# Patient Record
Sex: Female | Born: 1965 | Hispanic: Yes | Marital: Married | Smoking: Never smoker
Health system: Southern US, Community
[De-identification: ages and names within clinical notes are randomized; demographics above are authoritative.]

## PROBLEM LIST (undated history)

## (undated) DIAGNOSIS — M1990 Unspecified osteoarthritis, unspecified site: Secondary | ICD-10-CM

## (undated) HISTORY — PX: NO PAST SURGERIES: SHX2092

---

## 2006-02-02 ENCOUNTER — Emergency Department: Payer: Self-pay | Admitting: Emergency Medicine

## 2007-01-15 IMAGING — CT CT ABD-PELV W/ CM
1 of 2 series · 15 of 32 positions shown, 19 images · non-contrast
Comparison: none

REASON FOR EXAM: Abdomen and flank pain
COMMENTS:

[Series 2: abdomen · axial · 0.69mm/px · z∈[-296,+152]mm · 15 of 62 slices shown, 19 images]
[im 3/62  soft-tissue]
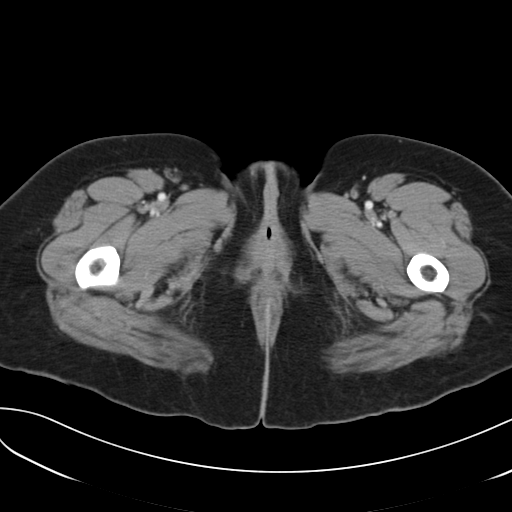
[im 3/62  bone]
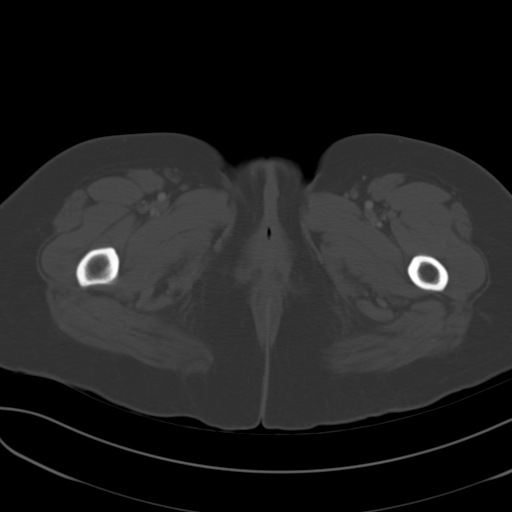
[im 8/62  soft-tissue]
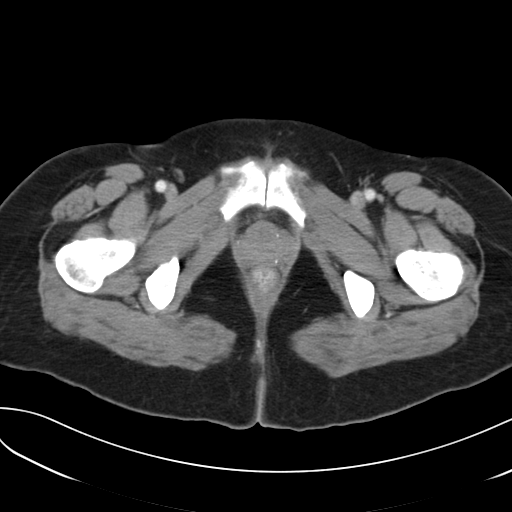
[im 13/62  soft-tissue]
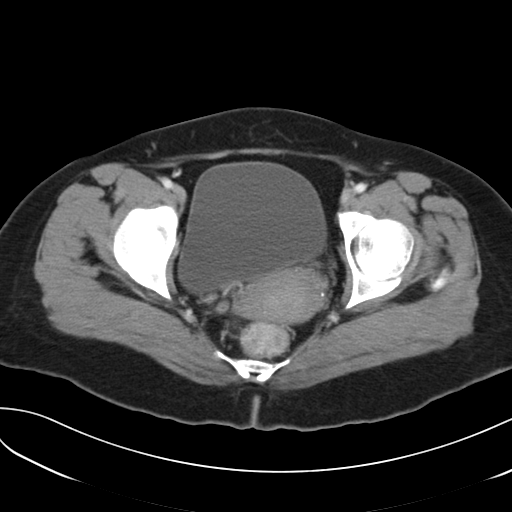
[im 18/62  soft-tissue]
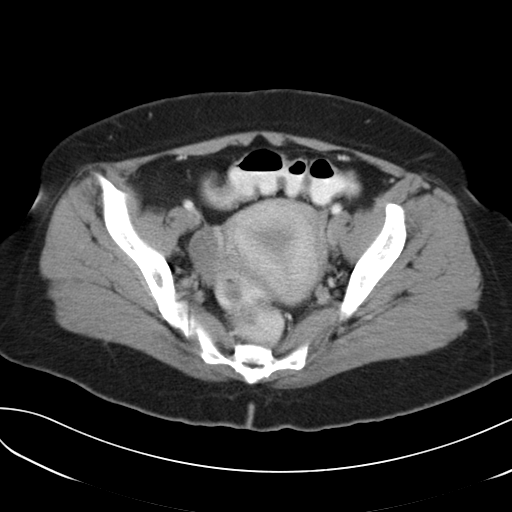
[im 21/62  soft-tissue]
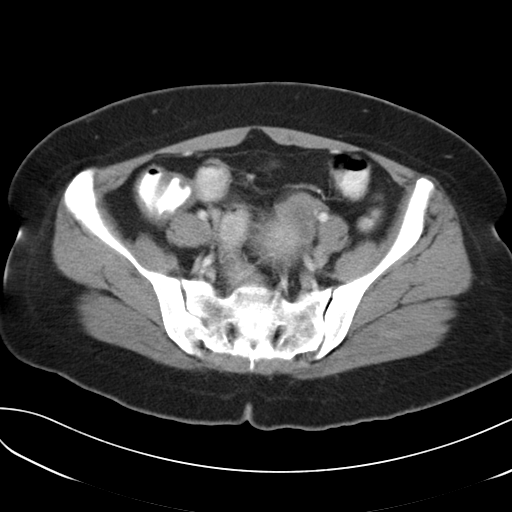
[im 26/62  soft-tissue]
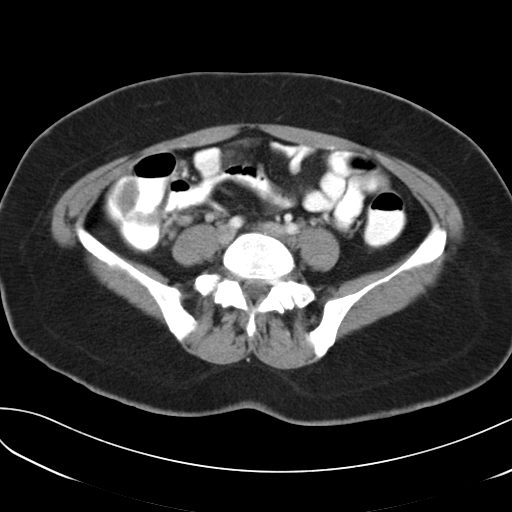
[im 31/62  soft-tissue]
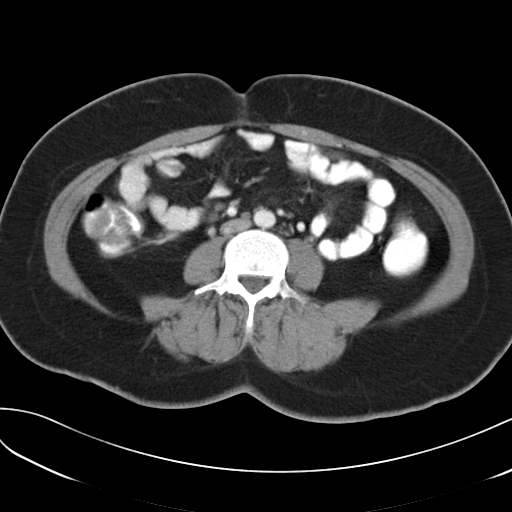
[im 36/62  soft-tissue]
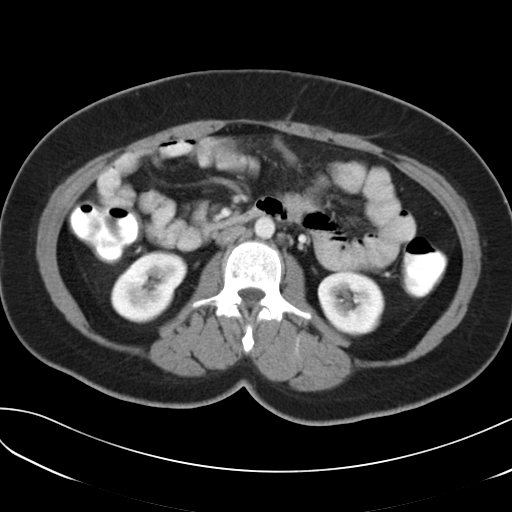
[im 41/62  soft-tissue]
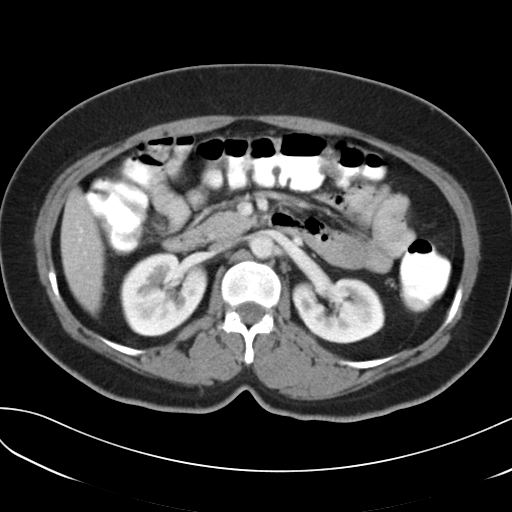
[im 41/62  bone]
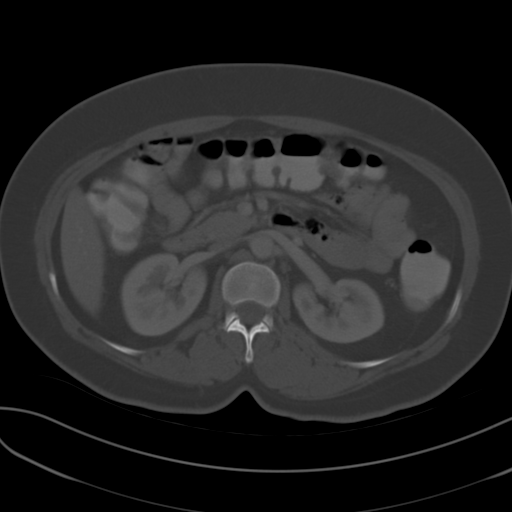
[im 44/62  soft-tissue]
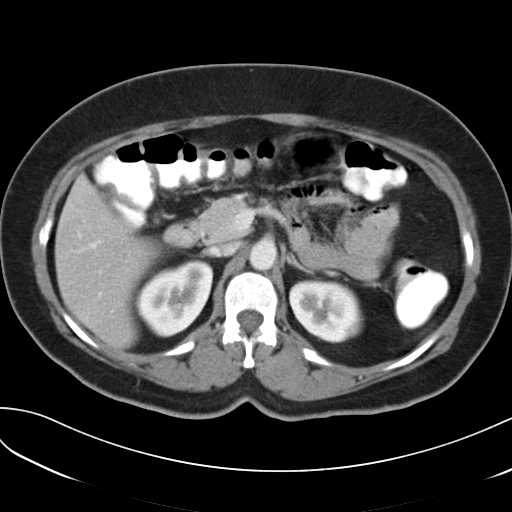
[im 49/62  soft-tissue]
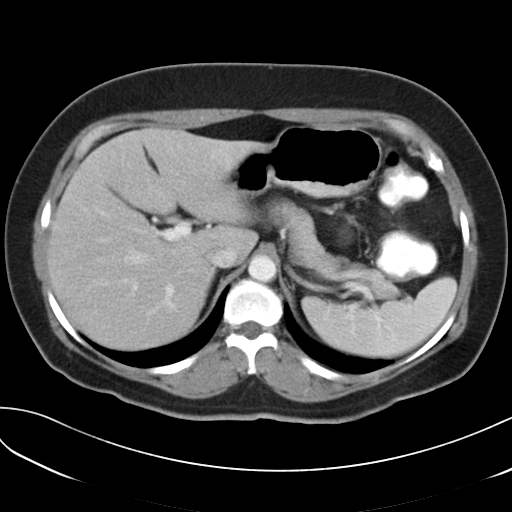
[im 51/62  lung]
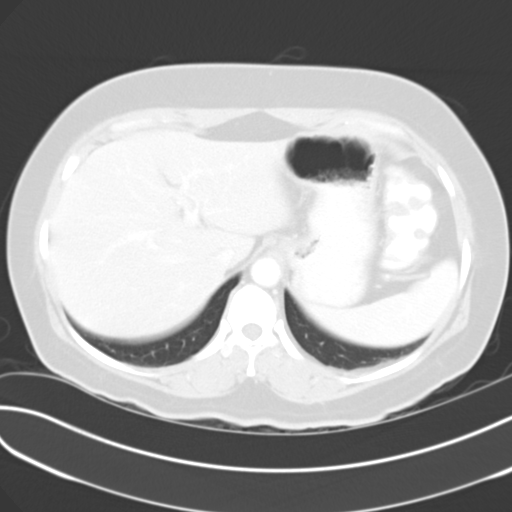
[im 54/62  soft-tissue]
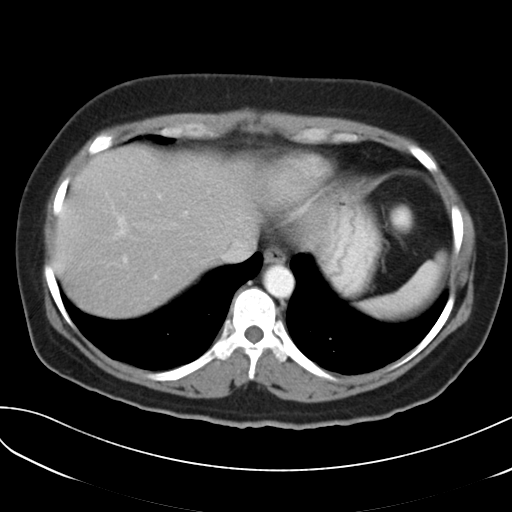
[im 54/62  lung]
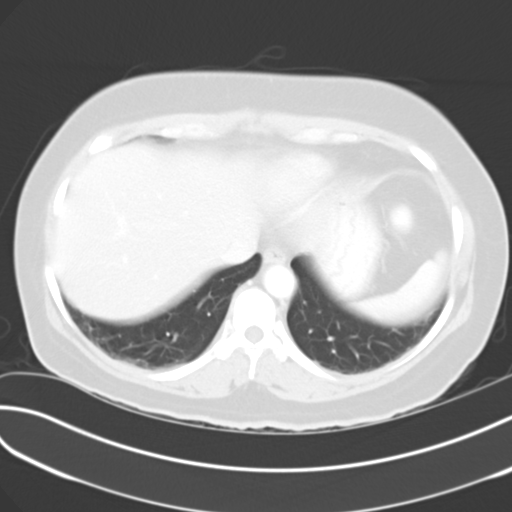
[im 56/62  lung]
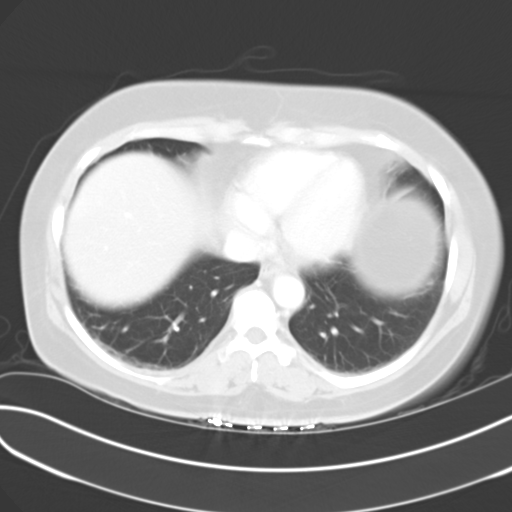
[im 59/62  soft-tissue]
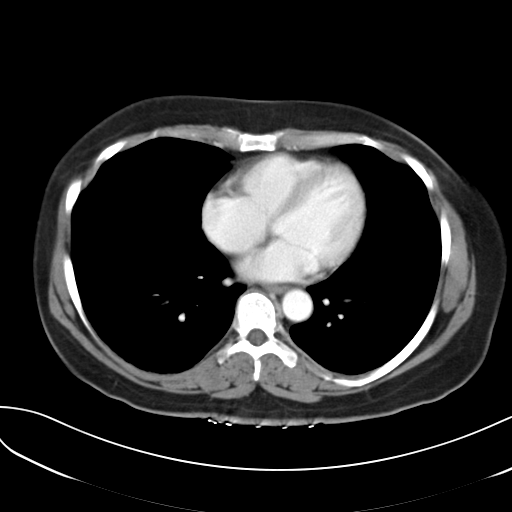
[im 59/62  lung]
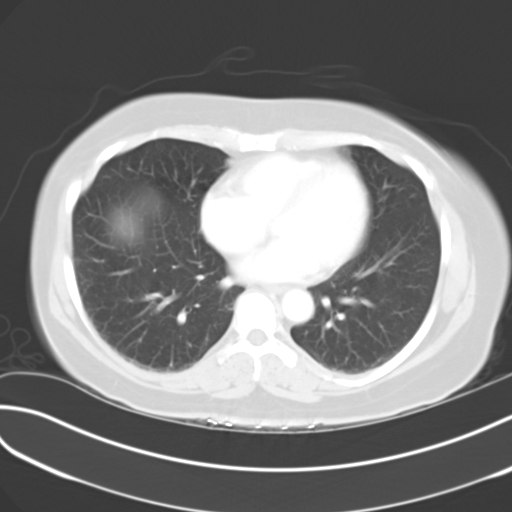

[15 of 32 positions shown; findings below may reference images not displayed]

PROCEDURE:     CT  - CT ABDOMEN / PELVIS  W  - February 02, 2006  [DATE]

RESULT:          Helical 8 mm sections were obtained from the lung bases
through the pubic symphysis status post intravenous administration of 100 ml
of [id number] and oral contrast.

Minimal bibasilar hypoventilation is appreciated.  The lung bases otherwise
are unremarkable.

The liver, spleen, adrenals, pancreas, and kidneys are unremarkable.  There
is no upper abdominal  free fluid, drainable loculated fluid collections,
masses or adenopathy.  The celiac, SMA, IMA, and portal vein are patent.
Evaluation of the RIGHT lower quadrant demonstrates no CT evidence to
suggest sequela of appendicitis.  Evaluation of the pelvis demonstrates no
evidence of free fluid, drainable loculated fluid collections, masses nor
adenopathy.  There is no evidence of intestinal obstruction nor
intraabdominal inflammatory change.

There is no evidence of abdominal aortic aneurysm.
IMPRESSION: No evidence of bowel obstruction or intraabdominal
inflammatory change.

Dr. Keana, of the emergency department, received a preliminary fax report
of these findings on 02/02/06 at [DATE] p.m. EST.

## 2008-09-02 ENCOUNTER — Emergency Department: Payer: Self-pay | Admitting: Emergency Medicine

## 2014-11-27 ENCOUNTER — Other Ambulatory Visit: Payer: Self-pay | Admitting: Nurse Practitioner

## 2014-11-27 DIAGNOSIS — Z1231 Encounter for screening mammogram for malignant neoplasm of breast: Secondary | ICD-10-CM

## 2015-01-08 ENCOUNTER — Other Ambulatory Visit: Payer: Self-pay | Admitting: Nurse Practitioner

## 2015-01-08 DIAGNOSIS — Z1231 Encounter for screening mammogram for malignant neoplasm of breast: Secondary | ICD-10-CM

## 2015-01-18 ENCOUNTER — Ambulatory Visit: Payer: Self-pay

## 2015-01-21 ENCOUNTER — Ambulatory Visit
Admission: RE | Admit: 2015-01-21 | Discharge: 2015-01-21 | Disposition: A | Discharge status: Home / Self Care | Payer: BLUE CROSS/BLUE SHIELD | Source: Ambulatory Visit | Attending: Nurse Practitioner | Admitting: Nurse Practitioner

## 2015-01-21 DIAGNOSIS — Z1231 Encounter for screening mammogram for malignant neoplasm of breast: Secondary | ICD-10-CM | POA: Diagnosis present

## 2017-10-26 ENCOUNTER — Other Ambulatory Visit: Payer: Self-pay | Admitting: Family Medicine

## 2017-10-26 DIAGNOSIS — Z1231 Encounter for screening mammogram for malignant neoplasm of breast: Secondary | ICD-10-CM

## 2019-01-03 ENCOUNTER — Other Ambulatory Visit: Payer: Self-pay

## 2019-01-03 DIAGNOSIS — Z20822 Contact with and (suspected) exposure to covid-19: Secondary | ICD-10-CM

## 2019-01-05 LAB — NOVEL CORONAVIRUS, NAA

## 2019-01-24 ENCOUNTER — Ambulatory Visit: Payer: Self-pay

## 2019-01-24 ENCOUNTER — Other Ambulatory Visit: Payer: Self-pay

## 2019-01-24 VITALS — BP 120/84 | HR 78 | Ht 63.0 in

## 2019-01-24 DIAGNOSIS — Z008 Encounter for other general examination: Secondary | ICD-10-CM

## 2019-01-24 LAB — POCT LIPID PANEL
Glucose Fasting, POC: 95 mg/dL (ref 70–99)
HDL: 53
LDL: 79
Non-HDL: 107
TC/HDL: 3
TC: 160
TRG: 142

## 2019-01-24 NOTE — Progress Notes (Signed)
     Patient ID: Shelia Jones, female    DOB: June 15, 1965, 53 y.o.   MRN: [id number]    Thank you!!  Apolonio Schneiders RN  [facility] Nurse Specialist [place]: [phone]  Cell:  [phone] Website: [email or web address]

## 2019-09-22 ENCOUNTER — Other Ambulatory Visit: Payer: Self-pay | Admitting: Orthopedic Surgery

## 2019-10-05 ENCOUNTER — Other Ambulatory Visit: Payer: Self-pay

## 2019-10-05 ENCOUNTER — Other Ambulatory Visit
Admission: RE | Admit: 2019-10-05 | Discharge: 2019-10-05 | Disposition: A | Discharge status: Home / Self Care | Payer: BC Managed Care – PPO | Source: Ambulatory Visit | Attending: Orthopedic Surgery | Admitting: Orthopedic Surgery

## 2019-10-05 HISTORY — DX: Unspecified osteoarthritis, unspecified site: M19.90

## 2019-10-05 NOTE — Patient Instructions (Signed)
Your procedure is scheduled on: Su procedimiento est programado para: 10-12-2019 Report to . Presntese a: [facility] To find out your arrival time please call [phone] between 1PM - 3PM on  Para saber su hora de llegada por favor llame al [phone] Eusebio Me la 1PM - 3PM el da 10-11-19  Remember: Instructions that are not followed completely may result in serious medical risk, up to and including death,  or upon the discretion of your surgeon and anesthesiologist your surgery may need to be rescheduled.  Recuerde: Las instrucciones que no se siguen completamente Armed forces logistics/support/administrative officer en un riesgo de salud grave, incluyendo hasta  la [place] o a discrecin de su cirujano y Scientific laboratory technician, su ciruga se puede posponer.   __X_ 1.Do not eat food after midnight the night before your procedure. No    gum chewing or hard candies. You may drink clear liquids up to 2 hours     before you are scheduled to arrive for your surgery- DO not drink clear     Liquids within 2 hours of the start of your surgery.     Clear Liquids include:    water, apple juice without pulp, clear carbohydrate drink such as    Clearfast of Gartorade, Black Coffee or Tea (Do not add anything to coffee or tea).      No coma nada despus de la medianoche de la noche anterior a su    procedimiento. No coma chicles ni caramelos duros. Puede tomar    lquidos claros hasta 2 horas antes de su hora programada de llegada al     hospital para su procedimiento. No tome lquidos claros durante el     transcurso de las 2 horas de su llegada programada al hospital para su     procedimiento, ya que esto puede llevar a que su procedimiento se    retrase o tenga que volver a Magazine features editor.  Los lquidos claros incluyen:          - Agua o jugo de [place] sin pulpa          - Bebidas claras con carbohidratos como ClearFast o Gatorade          - Caf negro o t claro (sin leche, sin cremas, no agregue nada al caf ni al t)  No tome nada  que no est en esta lista.  Los pacientes con diabetes tipo 1 y tipo 2 solo deben Printmaker.  Llame a la clnica de PreCare o a la unidad de Same Day Surgery si  tiene alguna pregunta sobre estas instrucciones.  BEBA LA BEBIDA DE CARBOHIDRATOS 2 HORAS ANTES DE LLEGAR AL HOSPITAL              _X__ 2.Do Not Smoke or use e-cigarettes For 24 Hours Prior to Your Surgery.    Do not use any chewable tobacco products for at least 6   hours prior to surgery.    No fume ni use cigarrillos electrnicos durante las 24 horas previas    a su [place].  No use ningn producto de tabaco masticable durante   al menos 6 horas antes de la [place].     __X_ 3. No alcohol for 24 hours before or after surgery.    No tome alcohol durante las 24 horas antes ni despus de la [place].   ____4. Bring all medications with you on the day of surgery if instructed.    Lleve todos los medicamentos con usted el da de su  ciruga si se le    ha indicado as.   __X__ 5. Notify your doctor if there is any change in your medical condition (cold,fever, infections).    Informe a su mdico si hay algn cambio en su condicin mdica  (resfriado, fiebre, infecciones).   Do not wear jewelry, make-up, hairpins, clips or nail polish.  No use joyas, maquillajes, pinzas/ganchos para el cabello ni esmalte de uas.  Do not wear lotions, powders, or perfumes. You may wear deodorant.  No use lociones, polvos o perfumes.  Puede usar desodorante.    Do not shave 48 hours prior to surgery. Men may shave face and neck.  No se afeite 48 horas antes de la [place].  Los hombres pueden [employer] cara  y el cuello.   Do not bring valuables to the hospital.   No lleve objetos [facility] is not responsible for any belongings or valuables.  [facility] no se hace responsable de ningn tipo de pertenencias u objetos de Geographical information systems officer.               Contacts, dentures or bridgework may not be worn into surgery.  Los lentes  de [place], las dentaduras postizas o puentes no se pueden usar en la [place].   Leave your suitcase in the car. After surgery it may be brought to your room.  Deje su maleta en el auto.  Despus de la ciruga podr traerla a su habitacin.   For patients admitted to the hospital, discharge time is determined by your  treatment team.  Para los pacientes que sean ingresados al hospital, el tiempo en el cual se le  dar de alta es determinado por su equipo de [place].   Patients discharged the day of surgery will not be allowed to drive home. A los pacientes que se les da de alta el mismo da de la ciruga no se les permitir conducir a Holiday representative.       ___X_ Use CHG Soap as directed          Utilice el jabn de CHG                CEPILLARSE LOS DIENTES LA MANANADE SU CIRUGIA   ___X_ Stop Anti-inflammatories on           Deje de tomar antiinflamatorios el da: 10/05/2019   ____ Stop supplements until after surgery            Deje de tomar suplementos hasta despus de la [place]

## 2019-10-10 ENCOUNTER — Encounter
Admission: RE | Admit: 2019-10-10 | Discharge: 2019-10-10 | Disposition: A | Discharge status: Home / Self Care | Payer: BC Managed Care – PPO | Source: Ambulatory Visit | Attending: Orthopedic Surgery | Admitting: Orthopedic Surgery

## 2019-10-10 ENCOUNTER — Other Ambulatory Visit: Payer: Self-pay

## 2019-10-10 DIAGNOSIS — Z20822 Contact with and (suspected) exposure to covid-19: Secondary | ICD-10-CM | POA: Diagnosis not present

## 2019-10-10 DIAGNOSIS — Z01818 Encounter for other preprocedural examination: Secondary | ICD-10-CM | POA: Insufficient documentation

## 2019-10-10 DIAGNOSIS — Z01810 Encounter for preprocedural cardiovascular examination: Secondary | ICD-10-CM | POA: Diagnosis not present

## 2019-10-10 LAB — CBC WITH DIFFERENTIAL/PLATELET
Abs Immature Granulocytes: 0.02 10*3/uL (ref 0.00–0.07)
Basophils Absolute: 0 10*3/uL (ref 0.0–0.1)
Basophils Relative: 1 %
Eosinophils Absolute: 0.1 10*3/uL (ref 0.0–0.5)
Eosinophils Relative: 2 %
HCT: 42.1 % (ref 36.0–46.0)
Hemoglobin: 13.4 g/dL (ref 12.0–15.0)
Immature Granulocytes: 0 %
Lymphocytes Relative: 36 %
Lymphs Abs: 1.7 10*3/uL (ref 0.7–4.0)
MCH: 26.8 pg (ref 26.0–34.0)
MCHC: 31.8 g/dL (ref 30.0–36.0)
MCV: 84.2 fL (ref 80.0–100.0)
Monocytes Absolute: 0.3 10*3/uL (ref 0.1–1.0)
Monocytes Relative: 7 %
Neutro Abs: 2.5 10*3/uL (ref 1.7–7.7)
Neutrophils Relative %: 54 %
Platelets: 283 10*3/uL (ref 150–400)
RBC: 5 MIL/uL (ref 3.87–5.11)
RDW: 15.8 % — ABNORMAL HIGH (ref 11.5–15.5)
WBC: 4.6 10*3/uL (ref 4.0–10.5)
nRBC: 0 % (ref 0.0–0.2)

## 2019-10-10 LAB — URINALYSIS, ROUTINE W REFLEX MICROSCOPIC
Bilirubin Urine: NEGATIVE
Glucose, UA: NEGATIVE mg/dL
Hgb urine dipstick: NEGATIVE
Ketones, ur: NEGATIVE mg/dL
Leukocytes,Ua: NEGATIVE
Nitrite: NEGATIVE
Protein, ur: NEGATIVE mg/dL
Specific Gravity, Urine: 1.004 — ABNORMAL LOW (ref 1.005–1.030)
pH: 5 (ref 5.0–8.0)

## 2019-10-10 LAB — COMPREHENSIVE METABOLIC PANEL
ALT: 15 U/L (ref 0–44)
AST: 17 U/L (ref 15–41)
Albumin: 4.1 g/dL (ref 3.5–5.0)
Alkaline Phosphatase: 67 U/L (ref 38–126)
Anion gap: 9 (ref 5–15)
BUN: 10 mg/dL (ref 6–20)
CO2: 26 mmol/L (ref 22–32)
Calcium: 9 mg/dL (ref 8.9–10.3)
Chloride: 105 mmol/L (ref 98–111)
Creatinine, Ser: 0.64 mg/dL (ref 0.44–1.00)
GFR calc Af Amer: >60 mL/min (ref >60)
GFR calc non Af Amer: >60 mL/min (ref >60)
Glucose, Bld: 93 mg/dL (ref 70–99)
Potassium: 3.8 mmol/L (ref 3.5–5.1)
Sodium: 140 mmol/L (ref 135–145)
Total Bilirubin: 0.7 mg/dL (ref 0.3–1.2)
Total Protein: 7.2 g/dL (ref 6.5–8.1)

## 2019-10-10 LAB — SURGICAL PCR SCREEN
MRSA, PCR: NEGATIVE
Staphylococcus aureus: NEGATIVE

## 2019-10-10 LAB — TYPE AND SCREEN
ABO/RH(D): A POS
Antibody Screen: NEGATIVE

## 2019-10-10 LAB — SARS CORONAVIRUS 2 (TAT 6-24 HRS): SARS Coronavirus 2: NEGATIVE

## 2019-10-11 NOTE — H&P (Signed)
Chief Complaint  Patient presents with  . Left Knee - Pain  . Right Knee - Pain   Shelia Jones is a 54 y.o. female who presents today for evaluation of right knee pain. Patient has had years of progressive severe right knee pain that has failed treatment with cortisone and gel injections. Patient states her pain is interfering with quality of life and activities of daily living. She has a hard time walking, working due to her knee swelling and giving away. Her pain is located on the medial joint line of the right knee. Pain is severe 10 out of 10. She takes ibuprofen with little relief. She also notes some mild left knee pain but this is mild, 1 out of 10 along the medial joint line. She denies any groin pain, back pain numbness tingling radicular symptoms. 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 Patient is ready for total knee arthroplasty, states she is unable to keep up with her family members.  Past Medical History: History reviewed. No pertinent past medical history.  Past Surgical History: History reviewed. No pertinent surgical history.  Past Family History: Family History  Problem Relation Age of Onset  . No Known Problems Mother   Medications: Current Outpatient Medications Ordered in Epic  Medication Sig Dispense Refill  . ibuprofen (MOTRIN) 200 MG tablet Take 200 mg by mouth every 6 (six) hours as needed for Pain  . VITAMIN D2 50,000 unit capsule 2   No current Epic-ordered facility-administered medications on file.   Allergies: No Known Allergies   Review of Systems:  A comprehensive 14 point ROS was performed, reviewed by me today, and the pertinent orthopaedic findings are documented in the HPI.  Exam: BP 128/84  Wt 80.8 kg (178 lb 3.2 oz)  BMI 32.59 kg/m  General:  Well developed, well nourished, no apparent distress, normal affect, normal gait with no antalgic component.   HEENT: Head normocephalic, atraumatic, PERRL.   Abdomen: Soft,  non tender, non distended, Bowel sounds present.  Heart: Examination of the heart reveals regular, rate, and rhythm. There is no murmur noted on ascultation. There is a normal apical pulse.  Lungs: Lungs are clear to auscultation. There is no wheeze, rhonchi, or crackles. There is normal expansion of bilateral chest walls.   Examination of the right knee shows no swelling warmth erythema. Mild effusion. Severe tenderness along medial joint line. Severe pain with varus stress testing with no laxity. Mild varus deformity is not passively correctable. She is able to straight leg raise. Patella tracks well. No Baker's cyst noted. She has -3 to 115 degrees range of motion. She is neuro vas intact in right lower extremity.  Left knee shows no swelling warmth erythema or effusion. Nontender along the medial or lateral joint line. Stable to valgus varus stress testing. She is able to straight leg raise. Patella tracks well.  AP lateral sunrise views of the right knee are ordered interpreted by me in the office today. Impression: Patient has complete loss of joint space in the medial compartment of the right knee with varus deformity, spurring along the medial femoral condyle medial tibial plateau. Mild subchondral changes in the lateral compartment.  AP lateral sunrise views of the left knee are ordered interpreted by me in the office today. Impression: Patient has very mild joint space narrowing in the medial and lateral compartment with mild subchondral changes noted. No valgus or varus deformity. Patella tracks well the trochlear groove. No evidence of  acute bony abnormality or abnormal bony lesions. Mild patellofemoral arthritis with sclerotic changes present. Slight lateral tracking of the patella trochlear groove.  Impression: Primary osteoarthritis of right knee [M17.11] Primary osteoarthritis of right knee (primary encounter diagnosis) Primary osteoarthritis of left knee  Plan:  93. 54 year old  female with severe right knee osteoarthritis, complete loss of joint space in the medial compartment. She has failed all conservative treatment options consisting of over-the-counter medications, cortisone injections, viscosupplementation, activity modification and bracing. Pain interferes with her quality of life and activities of daily living. She would like to proceed with right total knee arthroplasty. Risks, benefits, complications of a right total knee arthroplasty have been discussed with the patient through interpreter. Patient has agreed and consented the procedure with Dr. Hessie Knows  This note was generated in part with voice recognition software and I apologize for any typographical errors that were not detected and corrected.  Feliberto Gottron MPA-C    Electronically signed by Feliberto Gottron, PA at 09/14/2019 10:16 AM EDT   Reviewed paper H+P, will be scanned into chart. No changes noted.

## 2019-10-12 ENCOUNTER — Observation Stay
Admission: RE | Admit: 2019-10-12 | Discharge: 2019-10-14 | Disposition: A | Discharge status: Home / Self Care | Payer: BC Managed Care – PPO | Attending: Orthopedic Surgery | Admitting: Orthopedic Surgery

## 2019-10-12 ENCOUNTER — Observation Stay: Payer: BC Managed Care – PPO

## 2019-10-12 ENCOUNTER — Inpatient Hospital Stay: Payer: BC Managed Care – PPO | Admitting: Certified Registered Nurse Anesthetist

## 2019-10-12 ENCOUNTER — Encounter
Admission: RE | Disposition: A | Discharge status: Home / Self Care | Payer: Self-pay | Source: Home / Self Care | Attending: Orthopedic Surgery

## 2019-10-12 ENCOUNTER — Encounter: Payer: Self-pay | Admitting: Orthopedic Surgery

## 2019-10-12 ENCOUNTER — Other Ambulatory Visit: Payer: Self-pay

## 2019-10-12 DIAGNOSIS — M170 Bilateral primary osteoarthritis of knee: Principal | ICD-10-CM | POA: Insufficient documentation

## 2019-10-12 DIAGNOSIS — G8918 Other acute postprocedural pain: Secondary | ICD-10-CM

## 2019-10-12 DIAGNOSIS — Z96651 Presence of right artificial knee joint: Secondary | ICD-10-CM

## 2019-10-12 HISTORY — PX: TOTAL KNEE ARTHROPLASTY: SHX125

## 2019-10-12 HISTORY — PX: APPLICATION OF WOUND VAC: SHX5189

## 2019-10-12 LAB — CBC
HCT: 38.6 % (ref 36.0–46.0)
Hemoglobin: 12.4 g/dL (ref 12.0–15.0)
MCH: 26.9 pg (ref 26.0–34.0)
MCHC: 32.1 g/dL (ref 30.0–36.0)
MCV: 83.7 fL (ref 80.0–100.0)
Platelets: 260 10*3/uL (ref 150–400)
RBC: 4.61 MIL/uL (ref 3.87–5.11)
RDW: 15.8 % — ABNORMAL HIGH (ref 11.5–15.5)
WBC: 10.1 10*3/uL (ref 4.0–10.5)
nRBC: 0 % (ref 0.0–0.2)

## 2019-10-12 LAB — BASIC METABOLIC PANEL
Anion gap: 5 (ref 5–15)
BUN: 8 mg/dL (ref 6–20)
CO2: 29 mmol/L (ref 22–32)
Calcium: 8.9 mg/dL (ref 8.9–10.3)
Chloride: 109 mmol/L (ref 98–111)
Creatinine, Ser: 0.51 mg/dL (ref 0.44–1.00)
GFR calc Af Amer: >60 mL/min (ref >60)
GFR calc non Af Amer: >60 mL/min (ref >60)
Glucose, Bld: 94 mg/dL (ref 70–99)
Potassium: 4 mmol/L (ref 3.5–5.1)
Sodium: 143 mmol/L (ref 135–145)

## 2019-10-12 LAB — ABO/RH: ABO/RH(D): A POS

## 2019-10-12 LAB — POCT PREGNANCY, URINE: Preg Test, Ur: NEGATIVE

## 2019-10-12 SURGERY — ARTHROPLASTY, KNEE, TOTAL
Anesthesia: Spinal | Site: Knee | Laterality: Right

## 2019-10-12 MED ORDER — LIDOCAINE HCL (PF) 2 % IJ SOLN
INTRAMUSCULAR | Status: AC
  Filled 2019-10-12: qty 5

## 2019-10-12 MED ORDER — MORPHINE SULFATE 10 MG/ML IJ SOLN
INTRAMUSCULAR | Status: DC | PRN
  Administered 2019-10-12: 10 mg via INTRAVENOUS

## 2019-10-12 MED ORDER — FENTANYL CITRATE (PF) 100 MCG/2ML IJ SOLN
INTRAMUSCULAR | Status: AC
  Filled 2019-10-12: qty 2

## 2019-10-12 MED ORDER — ZOLPIDEM TARTRATE 5 MG PO TABS: 5.0000 mg | ORAL_TABLET | Freq: Every evening | ORAL | Status: DC | PRN

## 2019-10-12 MED ORDER — CHLORHEXIDINE GLUCONATE 0.12 % MT SOLN
OROMUCOSAL | Status: AC
  Administered 2019-10-12: 15 mL via OROMUCOSAL
  Filled 2019-10-12: qty 15

## 2019-10-12 MED ORDER — TRANEXAMIC ACID-NACL 1000-0.7 MG/100ML-% IV SOLN
1000.0000 mg | Freq: Once | INTRAVENOUS | Status: AC
  Administered 2019-10-12: 1000 mg via INTRAVENOUS

## 2019-10-12 MED ORDER — METHOCARBAMOL 500 MG PO TABS
500.0000 mg | ORAL_TABLET | Freq: Four times a day (QID) | ORAL | Status: DC | PRN
  Administered 2019-10-13 (×2): 500 mg via ORAL
  Filled 2019-10-12 (×3): qty 1

## 2019-10-12 MED ORDER — CHLORHEXIDINE GLUCONATE 0.12 % MT SOLN: 15.0000 mL | Freq: Once | OROMUCOSAL | Status: AC

## 2019-10-12 MED ORDER — METOCLOPRAMIDE HCL 5 MG/ML IJ SOLN: 5.0000 mg | Freq: Three times a day (TID) | INTRAMUSCULAR | Status: DC | PRN

## 2019-10-12 MED ORDER — TRANEXAMIC ACID-NACL 1000-0.7 MG/100ML-% IV SOLN
INTRAVENOUS | Status: AC
  Filled 2019-10-12: qty 100

## 2019-10-12 MED ORDER — ACETAMINOPHEN 325 MG PO TABS
325.0000 mg | ORAL_TABLET | Freq: Four times a day (QID) | ORAL | Status: DC | PRN
  Administered 2019-10-13: 650 mg via ORAL
  Filled 2019-10-12: qty 2

## 2019-10-12 MED ORDER — MORPHINE SULFATE (PF) 10 MG/ML IV SOLN
INTRAVENOUS | Status: AC
  Filled 2019-10-12: qty 1

## 2019-10-12 MED ORDER — FAMOTIDINE 20 MG PO TABS
ORAL_TABLET | ORAL | Status: AC
  Administered 2019-10-12: 20 mg via ORAL
  Filled 2019-10-12: qty 1

## 2019-10-12 MED ORDER — METOCLOPRAMIDE HCL 10 MG PO TABS: 5.0000 mg | ORAL_TABLET | Freq: Three times a day (TID) | ORAL | Status: DC | PRN

## 2019-10-12 MED ORDER — SODIUM CHLORIDE 0.9 % IV SOLN
INTRAVENOUS | Status: DC | PRN
  Administered 2019-10-12: 30 ug/min via INTRAVENOUS

## 2019-10-12 MED ORDER — BUPIVACAINE-EPINEPHRINE (PF) 0.25% -1:200000 IJ SOLN
INTRAMUSCULAR | Status: DC | PRN
  Administered 2019-10-12: 30 mL via PERINEURAL

## 2019-10-12 MED ORDER — PHENYLEPHRINE HCL (PRESSORS) 10 MG/ML IV SOLN
INTRAVENOUS | Status: DC | PRN
  Administered 2019-10-12: 100 ug via INTRAVENOUS

## 2019-10-12 MED ORDER — PANTOPRAZOLE SODIUM 40 MG PO TBEC
40.0000 mg | DELAYED_RELEASE_TABLET | Freq: Every day | ORAL | Status: DC
  Administered 2019-10-13 – 2019-10-14 (×2): 40 mg via ORAL
  Filled 2019-10-12 (×2): qty 1

## 2019-10-12 MED ORDER — HYDROCODONE-ACETAMINOPHEN 7.5-325 MG PO TABS
1.0000 | ORAL_TABLET | ORAL | Status: DC | PRN
  Administered 2019-10-13: 2 via ORAL
  Filled 2019-10-12: qty 2

## 2019-10-12 MED ORDER — MENTHOL 3 MG MT LOZG
1.0000 | LOZENGE | OROMUCOSAL | Status: DC | PRN
  Filled 2019-10-12: qty 9

## 2019-10-12 MED ORDER — METHOCARBAMOL 1000 MG/10ML IJ SOLN
500.0000 mg | Freq: Four times a day (QID) | INTRAVENOUS | Status: DC | PRN
  Filled 2019-10-12: qty 5

## 2019-10-12 MED ORDER — ONDANSETRON HCL 4 MG PO TABS: 4.0000 mg | ORAL_TABLET | Freq: Four times a day (QID) | ORAL | Status: DC | PRN

## 2019-10-12 MED ORDER — TRAMADOL HCL 50 MG PO TABS
50.0000 mg | ORAL_TABLET | Freq: Four times a day (QID) | ORAL | Status: DC
  Administered 2019-10-12 – 2019-10-14 (×8): 50 mg via ORAL
  Filled 2019-10-12 (×8): qty 1

## 2019-10-12 MED ORDER — ORAL CARE MOUTH RINSE: 15.0000 mL | Freq: Once | OROMUCOSAL | Status: AC

## 2019-10-12 MED ORDER — ONDANSETRON HCL 4 MG/2ML IJ SOLN
INTRAMUSCULAR | Status: DC | PRN
  Administered 2019-10-12: 4 mg via INTRAVENOUS

## 2019-10-12 MED ORDER — ENOXAPARIN SODIUM 30 MG/0.3ML ~~LOC~~ SOLN
30.0000 mg | Freq: Two times a day (BID) | SUBCUTANEOUS | Status: DC
  Administered 2019-10-13 – 2019-10-14 (×3): 30 mg via SUBCUTANEOUS
  Filled 2019-10-12 (×3): qty 0.3

## 2019-10-12 MED ORDER — MAGNESIUM CITRATE PO SOLN
1.0000 | Freq: Once | ORAL | Status: DC | PRN
  Filled 2019-10-12: qty 296

## 2019-10-12 MED ORDER — BISACODYL 10 MG RE SUPP
10.0000 mg | Freq: Every day | RECTAL | Status: DC | PRN
  Administered 2019-10-14: 10 mg via RECTAL
  Filled 2019-10-12: qty 1

## 2019-10-12 MED ORDER — PROPOFOL 10 MG/ML IV BOLUS
INTRAVENOUS | Status: DC | PRN
  Administered 2019-10-12: 20 mg via INTRAVENOUS
  Administered 2019-10-12: 30 mg via INTRAVENOUS
  Administered 2019-10-12: 20 mg via INTRAVENOUS

## 2019-10-12 MED ORDER — PROPOFOL 500 MG/50ML IV EMUL
INTRAVENOUS | Status: AC
  Filled 2019-10-12: qty 50

## 2019-10-12 MED ORDER — KETOROLAC TROMETHAMINE 30 MG/ML IJ SOLN
INTRAMUSCULAR | Status: AC
  Filled 2019-10-12: qty 1

## 2019-10-12 MED ORDER — KETOROLAC TROMETHAMINE 30 MG/ML IJ SOLN
INTRAMUSCULAR | Status: DC | PRN
  Administered 2019-10-12: 30 mg via INTRAVENOUS

## 2019-10-12 MED ORDER — ONDANSETRON HCL 4 MG/2ML IJ SOLN
INTRAMUSCULAR | Status: AC
  Filled 2019-10-12: qty 2

## 2019-10-12 MED ORDER — ONDANSETRON HCL 4 MG/2ML IJ SOLN: 4.0000 mg | Freq: Once | INTRAMUSCULAR | Status: DC | PRN

## 2019-10-12 MED ORDER — BUPIVACAINE HCL (PF) 0.25 % IJ SOLN
INTRAMUSCULAR | Status: AC
  Filled 2019-10-12: qty 60

## 2019-10-12 MED ORDER — FENTANYL CITRATE (PF) 100 MCG/2ML IJ SOLN: 25.0000 ug | INTRAMUSCULAR | Status: DC | PRN

## 2019-10-12 MED ORDER — DOCUSATE SODIUM 100 MG PO CAPS
100.0000 mg | ORAL_CAPSULE | Freq: Two times a day (BID) | ORAL | Status: DC
  Administered 2019-10-12 – 2019-10-14 (×4): 100 mg via ORAL
  Filled 2019-10-12 (×4): qty 1

## 2019-10-12 MED ORDER — FAMOTIDINE 20 MG PO TABS: 20.0000 mg | ORAL_TABLET | Freq: Once | ORAL | Status: AC

## 2019-10-12 MED ORDER — SODIUM CHLORIDE 0.9 % IV SOLN
INTRAVENOUS | Status: DC | PRN
  Administered 2019-10-12: 60 mL

## 2019-10-12 MED ORDER — BUPIVACAINE HCL (PF) 0.5 % IJ SOLN
INTRAMUSCULAR | Status: DC | PRN
  Administered 2019-10-12: 3 mL

## 2019-10-12 MED ORDER — LACTATED RINGERS IV SOLN: INTRAVENOUS | Status: DC

## 2019-10-12 MED ORDER — SODIUM CHLORIDE FLUSH 0.9 % IV SOLN
INTRAVENOUS | Status: AC
  Filled 2019-10-12: qty 80

## 2019-10-12 MED ORDER — CEFAZOLIN SODIUM-DEXTROSE 2-4 GM/100ML-% IV SOLN
2.0000 g | Freq: Four times a day (QID) | INTRAVENOUS | Status: AC
  Administered 2019-10-12 (×2): 2 g via INTRAVENOUS
  Filled 2019-10-12 (×2): qty 100

## 2019-10-12 MED ORDER — BUPIVACAINE HCL (PF) 0.5 % IJ SOLN
INTRAMUSCULAR | Status: AC
  Filled 2019-10-12: qty 10

## 2019-10-12 MED ORDER — PROPOFOL 10 MG/ML IV BOLUS
INTRAVENOUS | Status: AC
  Filled 2019-10-12: qty 20

## 2019-10-12 MED ORDER — FENTANYL CITRATE (PF) 100 MCG/2ML IJ SOLN
INTRAMUSCULAR | Status: DC | PRN
  Administered 2019-10-12 (×2): 25 ug via INTRAVENOUS

## 2019-10-12 MED ORDER — NEOMYCIN-POLYMYXIN B GU 40-200000 IR SOLN
Status: AC
  Filled 2019-10-12: qty 20

## 2019-10-12 MED ORDER — HYDROCODONE-ACETAMINOPHEN 5-325 MG PO TABS
1.0000 | ORAL_TABLET | ORAL | Status: DC | PRN
  Administered 2019-10-13 – 2019-10-14 (×3): 2 via ORAL
  Filled 2019-10-12 (×3): qty 2

## 2019-10-12 MED ORDER — ONDANSETRON HCL 4 MG/2ML IJ SOLN: 4.0000 mg | Freq: Four times a day (QID) | INTRAMUSCULAR | Status: DC | PRN

## 2019-10-12 MED ORDER — CEFAZOLIN SODIUM-DEXTROSE 2-4 GM/100ML-% IV SOLN
2.0000 g | INTRAVENOUS | Status: AC
  Administered 2019-10-12: 2 g via INTRAVENOUS

## 2019-10-12 MED ORDER — BUPIVACAINE LIPOSOME 1.3 % IJ SUSP
INTRAMUSCULAR | Status: AC
  Filled 2019-10-12: qty 20

## 2019-10-12 MED ORDER — MIDAZOLAM HCL 5 MG/5ML IJ SOLN
INTRAMUSCULAR | Status: DC | PRN
  Administered 2019-10-12: 2 mg via INTRAVENOUS

## 2019-10-12 MED ORDER — MIDAZOLAM HCL 2 MG/2ML IJ SOLN
INTRAMUSCULAR | Status: AC
  Filled 2019-10-12: qty 2

## 2019-10-12 MED ORDER — PROPOFOL 500 MG/50ML IV EMUL
INTRAVENOUS | Status: DC | PRN
  Administered 2019-10-12: 100 ug/kg/min via INTRAVENOUS

## 2019-10-12 MED ORDER — ALUM & MAG HYDROXIDE-SIMETH 200-200-20 MG/5ML PO SUSP: 30.0000 mL | ORAL | Status: DC | PRN

## 2019-10-12 MED ORDER — CEFAZOLIN SODIUM-DEXTROSE 2-4 GM/100ML-% IV SOLN
INTRAVENOUS | Status: AC
  Filled 2019-10-12: qty 100

## 2019-10-12 MED ORDER — PHENOL 1.4 % MT LIQD
1.0000 | OROMUCOSAL | Status: DC | PRN
  Filled 2019-10-12: qty 177

## 2019-10-12 MED ORDER — MAGNESIUM HYDROXIDE 400 MG/5ML PO SUSP
30.0000 mL | Freq: Every day | ORAL | Status: DC | PRN
  Administered 2019-10-14: 30 mL via ORAL
  Filled 2019-10-12: qty 30

## 2019-10-12 MED ORDER — PHENYLEPHRINE HCL (PRESSORS) 10 MG/ML IV SOLN
INTRAVENOUS | Status: AC
  Filled 2019-10-12: qty 1

## 2019-10-12 MED ORDER — DIPHENHYDRAMINE HCL 12.5 MG/5ML PO ELIX: 12.5000 mg | ORAL_SOLUTION | ORAL | Status: DC | PRN

## 2019-10-12 MED ORDER — SODIUM CHLORIDE 0.9 % IV SOLN: INTRAVENOUS | Status: DC

## 2019-10-12 MED ORDER — LIDOCAINE HCL (CARDIAC) PF 50 MG/5ML IV SOSY
PREFILLED_SYRINGE | INTRAVENOUS | Status: DC | PRN
  Administered 2019-10-12: 5 mL via INTRAVENOUS

## 2019-10-12 MED ORDER — MORPHINE SULFATE (PF) 2 MG/ML IV SOLN: 0.5000 mg | INTRAVENOUS | Status: DC | PRN

## 2019-10-12 MED ORDER — NEOMYCIN-POLYMYXIN B GU 40-200000 IR SOLN
Status: DC | PRN
  Administered 2019-10-12: 16 mL

## 2019-10-12 SURGICAL SUPPLY — 65 items
BLADE SAGITTAL 25.0X1.19X90 (BLADE) ×2 IMPLANT
BLADE SAGITTAL 25.0X1.19X90MM (BLADE) ×1
BNDG ELASTIC 6X5.8 VLCR STR LF (GAUZE/BANDAGES/DRESSINGS) ×3 IMPLANT
CANISTER SUCT 1200ML W/VALVE (MISCELLANEOUS) ×3 IMPLANT
CANISTER SUCT 3000ML PPV (MISCELLANEOUS) ×6 IMPLANT
CANISTER WOUND CARE 500ML ATS (WOUND CARE) ×3 IMPLANT
CEMENT HV SMART SET (Cement) ×6 IMPLANT
CHLORAPREP W/TINT 26 (MISCELLANEOUS) ×6 IMPLANT
COOLER POLAR GLACIER W/PUMP (MISCELLANEOUS) ×3 IMPLANT
COVER WAND RF STERILE (DRAPES) ×3 IMPLANT
CUFF TOURN SGL QUICK 30 (TOURNIQUET CUFF) ×2
CUFF TRNQT CYL 30X4X21-28X (TOURNIQUET CUFF) ×1 IMPLANT
DRAPE 3/4 80X56 (DRAPES) ×6 IMPLANT
ELECT CAUTERY BLADE 6.4 (BLADE) ×3 IMPLANT
ELECT REM PT RETURN 9FT ADLT (ELECTROSURGICAL) ×3
ELECTRODE REM PT RTRN 9FT ADLT (ELECTROSURGICAL) ×1 IMPLANT
FEMORAL COMPONENT RIGHT SZ2P (Femur) ×3 IMPLANT
GAUZE SPONGE 4X4 12PLY STRL (GAUZE/BANDAGES/DRESSINGS) ×3 IMPLANT
GAUZE XEROFORM 1X8 LF (GAUZE/BANDAGES/DRESSINGS) ×3 IMPLANT
GLOVE BIOGEL PI IND STRL 9 (GLOVE) ×1 IMPLANT
GLOVE BIOGEL PI INDICATOR 9 (GLOVE) ×2
GLOVE INDICATOR 8.0 STRL GRN (GLOVE) ×3 IMPLANT
GLOVE SURG ORTHO 8.0 STRL STRW (GLOVE) ×3 IMPLANT
GLOVE SURG SYN 9.0  PF PI (GLOVE) ×2
GLOVE SURG SYN 9.0 PF PI (GLOVE) ×1 IMPLANT
GOWN SRG 2XL LVL 4 RGLN SLV (GOWNS) ×1 IMPLANT
GOWN STRL NON-REIN 2XL LVL4 (GOWNS) ×2
GOWN STRL REUS W/ TWL LRG LVL3 (GOWN DISPOSABLE) ×1 IMPLANT
GOWN STRL REUS W/ TWL XL LVL3 (GOWN DISPOSABLE) ×1 IMPLANT
GOWN STRL REUS W/TWL LRG LVL3 (GOWN DISPOSABLE) ×2
GOWN STRL REUS W/TWL XL LVL3 (GOWN DISPOSABLE) ×2
HOLDER FOLEY CATH W/STRAP (MISCELLANEOUS) ×3 IMPLANT
HOOD PEEL AWAY FLYTE STAYCOOL (MISCELLANEOUS) ×6 IMPLANT
INSERT TIBIAL SZ2 TIGHT (Insert) ×3 IMPLANT
KIT PREVENA INCISION MGT20CM45 (CANNISTER) ×3 IMPLANT
KIT TURNOVER KIT A (KITS) ×3 IMPLANT
NDL SAFETY ECLIPSE 18X1.5 (NEEDLE) ×1 IMPLANT
NEEDLE HYPO 18GX1.5 SHARP (NEEDLE) ×2
NEEDLE SPNL 18GX3.5 QUINCKE PK (NEEDLE) ×3 IMPLANT
NEEDLE SPNL 20GX3.5 QUINCKE YW (NEEDLE) ×3 IMPLANT
NS IRRIG 1000ML POUR BTL (IV SOLUTION) ×3 IMPLANT
PACK TOTAL KNEE (MISCELLANEOUS) ×3 IMPLANT
PAD WRAPON POLAR KNEE (MISCELLANEOUS) ×1 IMPLANT
PATELLA RESURFACING MEDACTA 02 (Bone Implant) ×3 IMPLANT
PENCIL SMOKE EVACUATOR COATED (MISCELLANEOUS) ×3 IMPLANT
PULSAVAC PLUS IRRIG FAN TIP (DISPOSABLE) ×3
SCALPEL PROTECTED #10 DISP (BLADE) ×6 IMPLANT
SOL .9 NS 3000ML IRR  AL (IV SOLUTION) ×2
SOL .9 NS 3000ML IRR UROMATIC (IV SOLUTION) ×1 IMPLANT
STAPLER SKIN PROX 35W (STAPLE) ×3 IMPLANT
STEM EXTENSION 11MMX30MM (Stem) ×3 IMPLANT
SUCTION FRAZIER HANDLE 10FR (MISCELLANEOUS) ×2
SUCTION TUBE FRAZIER 10FR DISP (MISCELLANEOUS) ×1 IMPLANT
SUT DVC 2 QUILL PDO  T11 36X36 (SUTURE) ×2
SUT DVC 2 QUILL PDO T11 36X36 (SUTURE) ×1 IMPLANT
SUT ETHIBOND 2 V 37 (SUTURE) ×3 IMPLANT
SUT V-LOC 90 ABS DVC 3-0 CL (SUTURE) ×3 IMPLANT
SYR 20ML LL LF (SYRINGE) ×3 IMPLANT
SYR 50ML LL SCALE MARK (SYRINGE) ×6 IMPLANT
TIB TRAY SZ 2 R FIXED (Joint) ×3 IMPLANT
TIP FAN IRRIG PULSAVAC PLUS (DISPOSABLE) ×1 IMPLANT
TOWEL OR 17X26 4PK STRL BLUE (TOWEL DISPOSABLE) ×3 IMPLANT
TOWER CARTRIDGE SMART MIX (DISPOSABLE) ×3 IMPLANT
TRAY FOLEY MTR SLVR 16FR STAT (SET/KITS/TRAYS/PACK) ×3 IMPLANT
WRAPON POLAR PAD KNEE (MISCELLANEOUS) ×3

## 2019-10-12 NOTE — Evaluation (Signed)
Physical Therapy Evaluation Patient Details Name: Shelia Jones MRN: [id number] DOB: 05/02/1965 Today's Date: 10/12/2019   History of Present Illness  Patient is s/p right TKA, no significant PMH  Clinical Impression  Patient received in bed, RN in room. She is agreeable to PT. Reports pain as 2/10 in right knee. Just received pain medicine. Patient performed bed mobility with min guard assist to bring right LE off bed. Min assist with sit to stand. Cues for hand placement. Patient ambulated 25 feet with RW and min guard assist. Good weight shifting ability and sequencing with cues. She will continue to benefit from skilled PT to improve strength, rom and functional independence to return home at discharge.       Follow Up Recommendations Home health PT;Supervision/Assistance - 24 hour    Equipment Recommendations  Rolling walker with 5" wheels    Recommendations for Other Services       Precautions / Restrictions Precautions Precautions: Fall Restrictions Weight Bearing Restrictions: No Other Position/Activity Restrictions: WBAT      Mobility  Bed Mobility Overal bed mobility: Needs Assistance Bed Mobility: Supine to Sit     Supine to sit: Min guard        Transfers Overall transfer level: Needs assistance Equipment used: Rolling walker (2 wheeled) Transfers: Sit to/from Stand Sit to Stand: Min guard         General transfer comment: cues for hand placement and WB status  Ambulation/Gait Ambulation/Gait assistance: Min guard Gait Distance (Feet): 25 Feet Assistive device: Rolling walker (2 wheeled) Gait Pattern/deviations: Step-to pattern;Decreased step length - right;Decreased step length - left;Shuffle Gait velocity: decreased   General Gait Details: patient is steady with ambulation, reports no dizziness, mild pain, cues for sequencing given  Stairs            Wheelchair Mobility    Modified Rankin (Stroke Patients Only)       Balance  Overall balance assessment: Needs assistance Sitting-balance support: Feet supported Sitting balance-Leahy Scale: Good     Standing balance support: Bilateral upper extremity supported;During functional activity Standing balance-Leahy Scale: Fair Standing balance comment: reliant on RW and external assist for safety                             Pertinent Vitals/Pain Pain Assessment: 0-10 Pain Score: 2  Pain Location: r knee Pain Descriptors / Indicators: Aching;Sore Pain Intervention(s): Monitored during session;Premedicated before session    Home Living Family/patient expects to be discharged to:: Private residence Living Arrangements: Spouse/significant other;Children Available Help at Discharge: Family;Available 24 hours/day Type of Home: House Home Access: Level entry     Home Layout: Other (Comment) (has 2 steps to get up to main part of home) Home Equipment: None      Prior Function Level of Independence: Independent               Hand Dominance        Extremity/Trunk Assessment   Upper Extremity Assessment Upper Extremity Assessment: Defer to OT evaluation    Lower Extremity Assessment Lower Extremity Assessment: RLE deficits/detail RLE Sensation: WNL RLE Coordination: decreased gross motor    Cervical / Trunk Assessment Cervical / Trunk Assessment: Normal  Communication   Communication: Prefers language other than English  Cognition Arousal/Alertness: Awake/alert Behavior During Therapy: WFL for tasks assessed/performed Overall Cognitive Status: Within Functional Limits for tasks assessed  General Comments: patient is very pleasant, cooperative, daughter in room during evaluation      General Comments      Exercises Total Joint Exercises Ankle Circles/Pumps: AROM;20 reps;Both Short Arc Quad: AAROM;10 reps;Right Goniometric ROM: 0-85   Assessment/Plan    PT Assessment Patient  needs continued PT services  PT Problem List Decreased strength;Decreased mobility;Decreased activity tolerance;Decreased balance;Pain;Decreased knowledge of use of DME;Decreased safety awareness;Decreased range of motion       PT Treatment Interventions DME instruction;Therapeutic activities;Gait training;Therapeutic exercise;Patient/family education;Stair training;Balance training;Functional mobility training;Neuromuscular re-education    PT Goals (Current goals can be found in the Care Plan section)  Acute Rehab PT Goals Patient Stated Goal: to return home with husband PT Goal Formulation: With patient/family Time For Goal Achievement: 10/19/19 Potential to Achieve Goals: Good    Frequency BID   Barriers to discharge        Co-evaluation               AM-PAC PT "6 Clicks" Mobility  Outcome Measure Help needed turning from your back to your side while in a flat bed without using bedrails?: A Little Help needed moving from lying on your back to sitting on the side of a flat bed without using bedrails?: A Little Help needed moving to and from a bed to a chair (including a wheelchair)?: A Little Help needed standing up from a chair using your arms (e.g., wheelchair or bedside chair)?: A Little Help needed to walk in hospital room?: A Little Help needed climbing 3-5 steps with a railing? : A Lot 6 Click Score: 17    End of Session Equipment Utilized During Treatment: Gait belt Activity Tolerance: Patient tolerated treatment well Patient left: in chair;with chair alarm set;with family/visitor present Nurse Communication: Mobility status PT Visit Diagnosis: Unsteadiness on feet (R26.81);Muscle weakness (generalized) (M62.81);Other abnormalities of gait and mobility (R26.89);Pain;Difficulty in walking, not elsewhere classified (R26.2) Pain - Right/Left: Right Pain - part of body: Knee    Time: 1415-1440 PT Time Calculation (min) (ACUTE ONLY): 25 min   Charges:   PT  Evaluation $PT Eval Moderate Complexity: 1 Mod PT Treatments $Gait Training: 8-22 mins        Edrie Ehrich, PT, GCS 10/12/19,2:59 PM

## 2019-10-12 NOTE — Op Note (Signed)
Date 10/12/2019  Time 9:15 am   PATIENT:   Shelia Jones   PRE-OPERATIVE DIAGNOSIS:  Primary localized osteoarthritis of right knee   POST-OPERATIVE DIAGNOSIS:  Same   PROCEDURE:  Procedure(s): Right TOTAL KNEE ARTHROPLASTY   SURGEON: Leitha Schuller, MD   ASSISTANTS: Cranston Neighbor, PA-C   ANESTHESIA:   spinal   EBL:   100   BLOOD ADMINISTERED:none   DRAINS: Incisional wound VAC    LOCAL MEDICATIONS USED:  MARCAINE    and OTHER Exparel morphine and Toradol   SPECIMEN:  No Specimen   DISPOSITION OF SPECIMEN:  N/A   COUNTS:  YES   TOURNIQUET:  57 at 300 mm Hg   IMPLANTS: Medacta  GMK sphere system with  2+ right femur, right 2 tibia with short stem and  12 mm insert.  Size  2 patella, all components cemented.   DICTATION: [name] Dictation   patient was brought to the operating room and spinal anesthesia was obtained.  After prepping and draping the  right leg in sterile fashion, and after patient identification and timeout procedures were completed, tourniquet was raised  and midline skin incision was made followed by medial parapatellar arthrotomy with  severe medial compartment osteoarthritis, advanced patellofemoral arthritis and  mild lateral compartment arthritis, partial synovectomy was also carried out.   The ACL and PCL and fat pad were excised along with anterior horns of the meniscus. The proximal tibia cutting guide from extra medullary guide and proximal tibia cut carried out The distal femoral cut was carried out in a similar fashion using the intramedullary guide with a 6 degree valgus cut      the sizing guide was applied and the size turned out to be 2+ drill was made.  The  2+ femoral cutting guide applied with anterior posterior and chamfer cuts made.  The posterior horns of the menisci were removed at this point.   Injection of the above medication was carried out after the femoral and tibial cuts were carried out.  The  2 baseplate trial was placed pinned into  position and proximal tibial preparation carried out with drilling hand reaming and the keel punch followed by placement of the  2+ femur and sizing the tibial insert size   12 millimeter gave the best fit with stability and full extension.  The distal femoral drill holes were made in the notch cut for the trochlear groove was then carried out with trials were then removed the patella was cut using the patellar cutting guide and it sized to a size  2 after drill holes have been made  The knee was irrigated with pulsatile lavage and the bony surfaces dried the tibial component was cemented into place first.  Excess cement was removed and the polyethylene insert placed with a torque screw placed with a torque screwdriver tightened.  The distal femoral component was placed and the knee was held in extension as the patellar button was clamped into place.  After the cement was set, excess cement was removed and the knee was again irrigated thoroughly thoroughly irrigated.  The tourniquet was let down and hemostasis checked with electrocautery. The arthrotomy was repaired with a heavy Quill suture,  followed by 3-0 V lock subcuticular closure, skin staples followed by incisional wound VAC and Polar Care.Marland Kitchen   PLAN OF CARE: Admit for overnight observation   PATIENT DISPOSITION:  PACU - hemodynamically stable.

## 2019-10-12 NOTE — Transfer of Care (Signed)
Immediate Anesthesia Transfer of Care Note  Patient: Shelia Jones  Procedure(s) Performed: RIGHT TOTAL KNEE ARTHROPLASTY (Right Knee) APPLICATION OF WOUND VAC [id number] (Right Knee)  Patient Location: PACU  Anesthesia Type:Spinal  Level of Consciousness: awake, alert  and oriented  Airway & Oxygen Therapy: Patient Spontanous Breathing and Patient connected to face mask oxygen  Post-op Assessment: Report given to RN and Post -op Vital signs reviewed and stable  Post vital signs: Reviewed  Last Vitals:  Vitals Value Taken Time  BP 89/54 10/12/19 0925  Temp    Pulse 88 10/12/19 0924  Resp 14 10/12/19 0924  SpO2 100 % 10/12/19 0924  Vitals shown include unvalidated device data.  Last Pain:  Vitals:   10/12/19 0701  TempSrc: Temporal  PainSc: 0-No pain      Patients Stated Pain Goal: 0 (10/12/19 0701)  Complications: No complications documented.

## 2019-10-12 NOTE — Anesthesia Preprocedure Evaluation (Addendum)
Anesthesia Evaluation  Patient identified by MRN, date of birth, ID band Patient awake    Reviewed: Allergy & Precautions, NPO status , Patient's Chart, lab work & pertinent test results  Airway Mallampati: III  TM Distance: >3 FB     Dental  (+) Teeth Intact   Pulmonary neg pulmonary ROS,    Pulmonary exam normal        Cardiovascular negative cardio ROS Normal cardiovascular exam     Neuro/Psych negative neurological ROS     GI/Hepatic negative GI ROS, Neg liver ROS,   Endo/Other  negative endocrine ROS  Renal/GU negative Renal ROS  negative genitourinary   Musculoskeletal  (+) Arthritis ,   Abdominal Normal abdominal exam  (+)   Peds negative pediatric ROS (+)  Hematology negative hematology ROS (+)   Anesthesia Other Findings   Reproductive/Obstetrics                            Anesthesia Physical Anesthesia Plan  ASA: II  Anesthesia Plan: Spinal   Post-op Pain Management:    Induction: Intravenous  PONV Risk Score and Plan:   Airway Management Planned: Nasal Cannula  Additional Equipment:   Intra-op Plan:   Post-operative Plan:   Informed Consent: I have reviewed the patients History and Physical, chart, labs and discussed the procedure including the risks, benefits and alternatives for the proposed anesthesia with the patient or authorized representative who has indicated his/her understanding and acceptance.     Dental advisory given  Plan Discussed with: CRNA and Surgeon  Anesthesia Plan Comments:         Anesthesia Quick Evaluation

## 2019-10-12 NOTE — Anesthesia Procedure Notes (Signed)
Spinal ° °Patient location during procedure: OR °Start time: 10/12/2019 7:20 AM °End time: 10/12/2019 7:27 AM °Staffing °Performed: resident/CRNA  °Anesthesiologist: Carroll, Paul, MD °Resident/CRNA: Logan, Benjamin, CRNA °Other anesthesia staff: Farlow, Summer, RN °Preanesthetic Checklist °Completed: patient identified, IV checked, site marked, risks and benefits discussed, surgical consent, monitors and equipment checked, pre-op evaluation and timeout performed °Spinal Block °Patient position: sitting °Prep: Betadine °Patient monitoring: heart rate, continuous pulse ox and blood pressure °Approach: midline °Location: L4-5 °Injection technique: single-shot °Needle °Needle type: Whitacre and Introducer  °Needle gauge: 24 G °Needle length: 9 cm °Additional Notes °Negative paresthesia. Negative blood return. Positive free-flowing CSF. Expiration date of kit checked and confirmed. Patient tolerated procedure well, without complications. ° ° ° ° ° ° °

## 2019-10-12 NOTE — Plan of Care (Signed)

## 2019-10-13 ENCOUNTER — Encounter: Payer: Self-pay | Admitting: Orthopedic Surgery

## 2019-10-13 DIAGNOSIS — M170 Bilateral primary osteoarthritis of knee: Secondary | ICD-10-CM | POA: Diagnosis not present

## 2019-10-13 MED ORDER — METHOCARBAMOL 500 MG PO TABS: 500.0000 mg | ORAL_TABLET | Freq: Four times a day (QID) | ORAL | 0 refills | Status: AC | PRN

## 2019-10-13 MED ORDER — HYDROCODONE-ACETAMINOPHEN 5-325 MG PO TABS: 1.0000 | ORAL_TABLET | ORAL | 0 refills | Status: AC | PRN

## 2019-10-13 MED ORDER — ENOXAPARIN SODIUM 40 MG/0.4ML ~~LOC~~ SOLN: 40.0000 mg | SUBCUTANEOUS | 0 refills | Status: AC

## 2019-10-13 MED ORDER — TRAMADOL HCL 50 MG PO TABS: 50.0000 mg | ORAL_TABLET | Freq: Four times a day (QID) | ORAL | 0 refills | Status: AC | PRN

## 2019-10-13 MED ORDER — MAGNESIUM HYDROXIDE 400 MG/5ML PO SUSP
30.0000 mL | Freq: Once | ORAL | Status: AC
  Administered 2019-10-13: 30 mL via ORAL
  Filled 2019-10-13: qty 30

## 2019-10-13 MED ORDER — DOCUSATE SODIUM 100 MG PO CAPS: 100.0000 mg | ORAL_CAPSULE | Freq: Two times a day (BID) | ORAL | 0 refills | Status: AC

## 2019-10-13 NOTE — Discharge Instructions (Signed)

## 2019-10-13 NOTE — Progress Notes (Signed)
Physical Therapy Treatment Patient Details Name: Shelia Jones MRN: [id number] DOB: 1965/12/04 Today's Date: 10/13/2019    History of Present Illness Patient is s/p right TKA, no significant PMH    PT Comments    Pt pleasant and motivated to participate during the session.  Pt made good progress towards goals and did not require physical assistance with any functional task.  Pt increased max amb distance to 100' this session with step-to pattern that gradually improved to beginning step-through pattern although with decreased RLE stance time.  Pt was steady with amb without LOB or buckling with no c/o adverse symptoms other than R knee pain with SpO2 and HR WNL.  Pt will benefit from HHPT services upon discharge to safely address deficits listed in patient problem list for decreased caregiver assistance and eventual return to PLOF.     Follow Up Recommendations  Home health PT;Supervision/Assistance - 24 hour     Equipment Recommendations  Rolling walker with 5" wheels    Recommendations for Other Services       Precautions / Restrictions Precautions Precautions: Fall Restrictions Weight Bearing Restrictions: Yes RLE Weight Bearing: Weight bearing as tolerated    Mobility  Bed Mobility Overal bed mobility: Modified Independent Bed Mobility: Supine to Sit;Sit to Supine     Supine to sit: Modified independent (Device/Increase time) Sit to supine: Modified independent (Device/Increase time)   General bed mobility comments: Extra time and effort and use of the bed rail  Transfers Overall transfer level: Needs assistance Equipment used: Rolling walker (2 wheeled) Transfers: Sit to/from Stand Sit to Stand: Min guard         General transfer comment: Mod verbal cues for hand placement and WB status  Ambulation/Gait Ambulation/Gait assistance: Min guard Gait Distance (Feet): 100 Feet Assistive device: Rolling walker (2 wheeled) Gait Pattern/deviations: Step-to  pattern;Step-through pattern;Decreased step length - left;Decreased stance time - right;Antalgic Gait velocity: decreased   General Gait Details: Slow, antalgic, step-to gait pattern that slowly progressed to beginning step-through pattern; mod verbal cues for general sequencing with the RW especially during turns   Stairs             Wheelchair Mobility    Modified Rankin (Stroke Patients Only)       Balance Overall balance assessment: Needs assistance Sitting-balance support: Feet supported Sitting balance-Leahy Scale: Good     Standing balance support: Bilateral upper extremity supported;During functional activity Standing balance-Leahy Scale: Good                              Cognition Arousal/Alertness: Awake/alert Behavior During Therapy: WFL for tasks assessed/performed Overall Cognitive Status: Within Functional Limits for tasks assessed                                        Exercises Total Joint Exercises Ankle Circles/Pumps: AROM;Both;10 reps Quad Sets: AROM;Strengthening;Right;10 reps;15 reps Hip ABduction/ADduction: AROM;AAROM;Both;10 reps Straight Leg Raises: AROM;AAROM;Both;10 reps Long Arc Quad: AROM;Strengthening;Right;10 reps;15 reps Knee Flexion: AROM;Strengthening;Right;10 reps;15 reps Goniometric ROM: R knee AROM: 3-70 deg, limited by pain Marching in Standing: AROM;Strengthening;Both;10 reps;Standing Other Exercises Other Exercises: HEP education per handout with emphasis on R knee AROM exercises Other Exercises: Positioning education to encourage R knee ext PROM    General Comments        Pertinent Vitals/Pain Pain Assessment: 0-10 Pain Score: 6  Pain Location: R knee Pain Descriptors / Indicators: Aching;Sore Pain Intervention(s): Premedicated before session;Monitored during session    Home Living                      Prior Function            PT Goals (current goals can now be found in  the care plan section) Progress towards PT goals: Progressing toward goals    Frequency    BID      PT Plan Current plan remains appropriate    Co-evaluation              AM-PAC PT "6 Clicks" Mobility   Outcome Measure  Help needed turning from your back to your side while in a flat bed without using bedrails?: A Little Help needed moving from lying on your back to sitting on the side of a flat bed without using bedrails?: A Little Help needed moving to and from a bed to a chair (including a wheelchair)?: A Little Help needed standing up from a chair using your arms (e.g., wheelchair or bedside chair)?: A Little Help needed to walk in hospital room?: A Little Help needed climbing 3-5 steps with a railing? : A Lot 6 Click Score: 17    End of Session Equipment Utilized During Treatment: Gait belt Activity Tolerance: Patient tolerated treatment well Patient left: in chair;with chair alarm set;with family/visitor present;with call bell/phone within reach;with SCD's reapplied;Other (comment) (Polar care donned to R knee) Nurse Communication: Mobility status PT Visit Diagnosis: Unsteadiness on feet (R26.81);Muscle weakness (generalized) (M62.81);Other abnormalities of gait and mobility (R26.89);Pain;Difficulty in walking, not elsewhere classified (R26.2) Pain - Right/Left: Right Pain - part of body: Knee     Time: 5102-5852 PT Time Calculation (min) (ACUTE ONLY): 38 min  Charges:  $Gait Training: 8-22 mins $Therapeutic Exercise: 8-22 mins $Therapeutic Activity: 8-22 mins                     D. Scott Kleber Crean PT, DPT 10/13/19, 10:57 AM

## 2019-10-13 NOTE — Progress Notes (Signed)
Physical Therapy Treatment Patient Details Name: Shelia Jones MRN: [id number] DOB: [birth year] Today's Date: 10/13/2019    History of Present Illness Patient is s/p elective right TKA, no significant PMH    PT Comments    Pt pleasant and motivated to participate during the session with daughter present to assist with interpretation.  Pt made good progress towards goals this session including grossly improved gait quality including increased cadence and RLE stance time.  Pt was steady with transfers and demonstrated good eccentric and concentric control during stair training as well as carryover with proper sequencing.  Pt reported no adverse symptoms other than R knee pain with SpO2 and HR WNL.  Pt will benefit from HHPT services upon discharge to safely address deficits listed in patient problem list for decreased caregiver assistance and eventual return to PLOF.     Follow Up Recommendations  Home health PT;Supervision/Assistance - 24 hour     Equipment Recommendations  Rolling walker with 5" wheels    Recommendations for Other Services       Precautions / Restrictions Precautions Precautions: Fall Restrictions Weight Bearing Restrictions: Yes RLE Weight Bearing: Weight bearing as tolerated    Mobility  Bed Mobility Overal bed mobility: Modified Independent       Supine to sit: Modified independent (Device/Increase time) Sit to supine: Modified independent (Device/Increase time)   General bed mobility comments: Extra time and effort and use of the bed rail  Transfers Overall transfer level: Needs assistance Equipment used: Rolling walker (2 wheeled) Transfers: Sit to/from Stand Sit to Stand: Min guard         General transfer comment: Mod verbal cues for hand and R foot placement  Ambulation/Gait Ambulation/Gait assistance: Min guard Gait Distance (Feet): 100 Feet x 2 Assistive device: Rolling walker (2 wheeled) Gait Pattern/deviations: Step-to  pattern;Step-through pattern;Decreased step length - left;Decreased stance time - right;Antalgic Gait velocity: decreased   General Gait Details: Slow, antalgic, step-to gait pattern that slowly progressed to beginning step-through pattern; mod verbal cues for general sequencing with the RW especially during turns   Stairs Stairs: Yes Stairs assistance: Min guard Stair Management: Backwards;Forwards;Step to pattern;With walker Number of Stairs: 4 General stair comments: Pt able to ascend/descend 2 steps x 2 with visual/verbal training with pt and family followed by practice with pt and family with good eccentric and conecentric control and carryover for proper sequencing   Wheelchair Mobility    Modified Rankin (Stroke Patients Only)       Balance Overall balance assessment: Needs assistance Sitting-balance support: Feet supported Sitting balance-Leahy Scale: Good     Standing balance support: Bilateral upper extremity supported;During functional activity Standing balance-Leahy Scale: Good                              Cognition Arousal/Alertness: Awake/alert Behavior During Therapy: WFL for tasks assessed/performed Overall Cognitive Status: Within Functional Limits for tasks assessed                                        Exercises Total Joint Exercises Ankle Circles/Pumps: AROM;Both;10 reps Quad Sets: AROM;Strengthening;Right;10 reps;15 reps Hip ABduction/ADduction: AAROM;10 reps;Right Straight Leg Raises: AAROM;10 reps;Right Long Arc Quad: AROM;Strengthening;Right;10 reps;15 reps Knee Flexion: AROM;Strengthening;Right;10 reps;15 reps Marching in Standing: AROM;Strengthening;Both;Standing;5 reps Other Exercises Other Exercises: HEP review per handout with emphasis on R knee AROM exercises Other Exercises:  Stair training with pt, daughter, and spouse    General Comments        Pertinent Vitals/Pain Pain Assessment: 0-10 Pain Score:  5  Pain Location: R knee Pain Descriptors / Indicators: Aching;Sore Pain Intervention(s): Premedicated before session;Monitored during session    Home Living                      Prior Function            PT Goals (current goals can now be found in the care plan section) Progress towards PT goals: Progressing toward goals    Frequency    BID      PT Plan Current plan remains appropriate    Co-evaluation              AM-PAC PT "6 Clicks" Mobility   Outcome Measure  Help needed turning from your back to your side while in a flat bed without using bedrails?: A Little Help needed moving from lying on your back to sitting on the side of a flat bed without using bedrails?: A Little Help needed moving to and from a bed to a chair (including a wheelchair)?: A Little Help needed standing up from a chair using your arms (e.g., wheelchair or bedside chair)?: A Little Help needed to walk in hospital room?: A Little Help needed climbing 3-5 steps with a railing? : A Little 6 Click Score: 18    End of Session Equipment Utilized During Treatment: Gait belt Activity Tolerance: Patient tolerated treatment well Patient left: with family/visitor present;with SCD's reapplied;Other (comment);with call bell/phone within reach;in bed;with bed alarm set (Polar care donned to R knee) Nurse Communication: Mobility status PT Visit Diagnosis: Unsteadiness on feet (R26.81);Muscle weakness (generalized) (M62.81);Other abnormalities of gait and mobility (R26.89);Pain;Difficulty in walking, not elsewhere classified (R26.2) Pain - Right/Left: Right Pain - part of body: Knee     Time: 0626-9485 PT Time Calculation (min) (ACUTE ONLY): 44 min  Charges:  $Gait Training: 23-37 mins $Therapeutic Exercise: 8-22 mins                     D. Scott Makaia Rappa PT, DPT 10/13/19, 4:00 PM

## 2019-10-13 NOTE — Anesthesia Postprocedure Evaluation (Signed)
Anesthesia Post Note  Patient: [employer]  Procedure(s) Performed: RIGHT TOTAL KNEE ARTHROPLASTY (Right Knee) APPLICATION OF WOUND VAC [id number] (Right Knee)  Patient location during evaluation: Nursing Unit Anesthesia Type: Spinal Level of consciousness: oriented and awake and alert Pain management: pain level controlled Vital Signs Assessment: post-procedure vital signs reviewed and stable Respiratory status: spontaneous breathing and respiratory function stable Cardiovascular status: blood pressure returned to baseline and stable Postop Assessment: no headache, no backache, no apparent nausea or vomiting, patient able to bend at knees and able to ambulate Anesthetic complications: no   No complications documented.   Last Vitals:  Vitals:   10/13/19 0428 10/13/19 0724  BP: 114/69 126/83  Pulse: 79 72  Resp: 14 17  Temp: 37.1 C 36.8 C  SpO2: 99% 100%    Last Pain:  Vitals:   10/13/19 0724  TempSrc: Oral  PainSc:                  Rosanne Gutting

## 2019-10-13 NOTE — TOC Initial Note (Signed)
Transition of Care [facility]) - Initial/Assessment Note    Patient Details  Name: [place] MRN: [id number] Date of Birth: [birth year]  [facility] CM/SW Contact:    Su Hilt, RN Phone Number: 10/13/2019, 3:41 PM  Clinical Narrative:                 Met with the patient and her husband and daughter in the room, She speaks spanish and her daughter interpreted the conversation She lives at home with her husband and Son, She does not have DME at home,m I notified Zac[facility] with [employer] that she needs a RW and a 3 in 1 She is set up with [facility] services, I requested the price of Lovenox and will notify the patient once obtained, he can afford her medications  and is up to date with her doctors, he husband provides transportation  Expected Discharge Plan: [place] Barriers to Discharge: Continued Medical Work up   Patient Goals and CMS Choice Patient states their goals for this hospitalization and ongoing recovery are:: Go home      Expected Discharge Plan and Services Expected Discharge Plan: [place]       Living arrangements for the past 2 months: [place]                 DME Arranged: Walker rolling, 3-N-1 DME Agency: [employer] Date DME Agency Contacted: 10/13/19 Time DME Agency Contacted: 75 Representative spoke with at DME Agency: [place] Arranged: PT, OT [place] Agency: [facility] (formerly [employer]) Date [place]: 10/13/19 Time [place]: 86 Representative spoke with at [place]: teresa  Prior Living Arrangements/Services Living arrangements for the past 2 months: [place] Lives with:: Spouse, Adult Children Patient language and need for interpreter reviewed:: Yes Do you feel safe going back to the place where you live?: Yes      Need for Family Participation in Patient Care: No (Comment) Care giver support system in place?: Yes  (comment)   Criminal Activity/Legal Involvement Pertinent to Current Situation/Hospitalization: No - Comment as needed  Activities of Daily Living Home Assistive Devices/Equipment: None ADL Screening (condition at time of admission) Patient's cognitive ability adequate to safely complete daily activities?: Yes Is the patient deaf or have difficulty hearing?: No Does the patient have difficulty seeing, even when wearing glasses/contacts?: No Does the patient have difficulty concentrating, remembering, or making decisions?: No Patient able to express need for assistance with ADLs?: Yes Does the patient have difficulty dressing or bathing?: No Independently performs ADLs?: Yes (appropriate for developmental age) Does the patient have difficulty walking or climbing stairs?: No Weakness of Legs: None Weakness of Arms/Hands: None  Permission Sought/Granted   Permission granted to share information with : Yes, Verbal Permission Granted              Emotional Assessment Appearance:: Appears stated age Attitude/Demeanor/Rapport: Engaged Affect (typically observed): Appropriate Orientation: : Oriented to Situation, Oriented to  Time, Oriented to Place, Oriented to Self Alcohol / Substance Use: Not Applicable Psych Involvement: No (comment)  Admission diagnosis:  S/P TKR (total knee replacement) using cement, right [Z96.651] Patient Active Problem List   Diagnosis Date Noted  . S/P TKR (total knee replacement) using cement, right 10/12/2019   PCP:  [place]:   [id number] [place] [address] [place]  [place] Phone: [phone] Fax: [phone]     Social Determinants of Health (SDOH) Interventions    Readmission Risk Interventions No flowsheet data found.

## 2019-10-13 NOTE — Progress Notes (Signed)
   Subjective: 1 Day Post-Op Procedure(s) (LRB): RIGHT TOTAL KNEE ARTHROPLASTY (Right) APPLICATION OF WOUND VAC [id number] (Right) Patient reports pain as moderate.   Patient is well, and has had no acute complaints or problems Denies any CP, SOB, ABD pain. We will continue therapy today.  Plan is to go Home after hospital stay.  Objective: Vital signs in last 24 hours: Temp:  [97.8 F (36.6 C)-99.1 F (37.3 C)] 98.3 F (36.8 C) (06/18 0724) Pulse Rate:  [56-88] 72 (06/18 0724) Resp:  [8-22] 17 (06/18 0724) BP: (89-138)/(54-84) 126/83 (06/18 0724) SpO2:  [97 %-100 %] 100 % (06/18 0724)  Intake/Output from previous day: 06/17 0701 - 06/18 0700 In: 2533.9 [P.O.:120; I.V.:2213.9; IV Piggyback:200] Out: 4020 [Urine:4000; Blood:20] Intake/Output this shift: Total I/O In: -  Out: 700 [Urine:700]  Recent Labs    10/10/19 0836 10/12/19 1311  HGB 13.4 12.4   Recent Labs    10/10/19 0836 10/12/19 1311  WBC 4.6 10.1  RBC 5.00 4.61  HCT 42.1 38.6  PLT 283 260   Recent Labs    10/10/19 0836 10/12/19 1311  NA 140 143  K 3.8 4.0  CL 105 109  CO2 26 29  BUN 10 8  CREATININE 0.64 0.51  GLUCOSE 93 94  CALCIUM 9.0 8.9   No results for input(s): LABPT, INR in the last 72 hours.  EXAM General - Patient is Alert, Appropriate and Oriented Extremity - Neurovascular intact Sensation intact distally Intact pulses distally Dorsiflexion/Plantar flexion intact No cellulitis present Compartment soft Dressing - dressing C/D/I and no drainage, prevena intact with out drainage Motor Function - intact, moving foot and toes well on exam.   Past Medical History:  Diagnosis Date  . Arthritis     Assessment/Plan:   1 Day Post-Op Procedure(s) (LRB): RIGHT TOTAL KNEE ARTHROPLASTY (Right) APPLICATION OF WOUND VAC [id number] (Right) Active Problems:   S/P TKR (total knee replacement) using cement, right  Estimated body mass index is 32.92 kg/m as calculated from the  following:   Height as of this encounter: 5\' 2"  (1.575 m).   Weight as of this encounter: 81.6 kg. Advance diet Up with therapy  Needs BM Labs and VSS Encourage use of norco in addition to ultram CM to assist with discharge  DVT Prophylaxis - Lovenox, Foot Pumps and TED hose Weight-Bearing as tolerated to right leg   T. , PA-C [facility] 10/13/2019, 7:59 AM

## 2019-10-13 NOTE — Discharge Summary (Signed)
Physician Discharge Summary  Patient ID: Shelia Jones MRN: [id number] DOB/AGE: 1965/10/12 54 y.o.  Admit date: 10/12/2019 Discharge date:10/14/2019 Admission Diagnoses:  S/P TKR (total knee replacement) using cement, right [Z96.651]   Discharge Diagnoses: Patient Active Problem List   Diagnosis Date Noted  . S/P TKR (total knee replacement) using cement, right 10/12/2019    Past Medical History:  Diagnosis Date  . Arthritis      Transfusion: none   Consultants (if any):   Discharged Condition: Improved  Hospital Course: Shelia Jones is an 54 y.o. female who was admitted 10/12/2019 with a diagnosis of right knee osteoathritis and went to the operating room on 10/12/2019 and underwent the above named procedures.    Surgeries: Procedure(s): RIGHT TOTAL KNEE ARTHROPLASTY APPLICATION OF WOUND VAC [id number] on 10/12/2019 Patient tolerated the surgery well. Taken to PACU where she was stabilized and then transferred to the orthopedic floor.  Started on Lovenox 30 mg q 12 hrs. Foot pumps applied bilaterally at 80 mm. Heels elevated on bed with rolled towels. No evidence of DVT. Negative Homan. Physical therapy started on day #1 for gait training and transfer. OT started day #1 for ADL and assisted devices.  Patient's foley was d/c on day #1. Patient's IV was d/c on day #2.  On post op day #2 patient was stable and ready for discharge to home with HHPT.  Implants: Medacta GMK sphere system with 2+ rightfemur, right 2tibia with short stem and 28mm insert. Size 2patella, all components cemented.   She was given perioperative antibiotics:  Anti-infectives (From admission, onward)   Start     Dose/Rate Route Frequency Ordered Stop   10/12/19 1330  ceFAZolin (ANCEF) IVPB 2g/100 mL premix        2 g 200 mL/hr over 30 Minutes Intravenous Every 6 hours 10/12/19 0923 10/12/19 1933   10/12/19 0600  ceFAZolin (ANCEF) IVPB 2g/100 mL premix        2 g 200 mL/hr over 30  Minutes Intravenous On call to O.R. 10/12/19 0241 10/12/19 0746   10/12/19 0600  ceFAZolin (ANCEF) 2-4 GM/100ML-% IVPB       Note to Pharmacy: Register, Karen   : cabinet override      10/12/19 0600 10/12/19 0740    .  She was given sequential compression devices, early ambulation, and lovenox TEDs for DVT prophylaxis.  She benefited maximally from the hospital stay and there were no complications.    Recent vital signs:  Vitals:   10/13/19 2339 10/14/19 0834  BP: 128/71 135/74  Pulse: 75 73  Resp: 18 20  Temp: 98 F (36.7 C) 97.8 F (36.6 C)  SpO2: 100% 97%    Recent laboratory studies:  Lab Results  Component Value Date   HGB 12.4 10/12/2019   HGB 13.4 10/10/2019   Lab Results  Component Value Date   WBC 10.1 10/12/2019   PLT 260 10/12/2019   No results found for: INR Lab Results  Component Value Date   NA 143 10/12/2019   K 4.0 10/12/2019   CL 109 10/12/2019   CO2 29 10/12/2019   BUN 8 10/12/2019   CREATININE 0.51 10/12/2019   GLUCOSE 94 10/12/2019    Discharge Medications:   Allergies as of 10/14/2019   No Known Allergies     Medication List    STOP taking these medications   ibuprofen 200 MG tablet Commonly known as: ADVIL     TAKE these medications   docusate sodium 100 MG capsule Commonly  known as: COLACE Take 1 capsule (100 mg total) by mouth 2 (two) times daily.   enoxaparin 40 MG/0.4ML injection Commonly known as: Lovenox Inject 0.4 mLs (40 mg total) into the skin daily for 14 days.   HYDROcodone-acetaminophen 5-325 MG tablet Commonly known as: NORCO/VICODIN Take 1-2 tablets by mouth every 4 (four) hours as needed for moderate pain (pain score 4-6).   methocarbamol 500 MG tablet Commonly known as: ROBAXIN Take 1 tablet (500 mg total) by mouth every 6 (six) hours as needed for muscle spasms.   traMADol 50 MG tablet Commonly known as: ULTRAM Take 1 tablet (50 mg total) by mouth every 6 (six) hours as needed.            Durable  Medical Equipment  (From admission, onward)         Start     Ordered   10/12/19 0924  DME Walker rolling  Once       Question Answer Comment  Walker: With 5 Inch Wheels   Patient needs a walker to treat with the following condition S/P TKR (total knee replacement) using cement, right      10/12/19 0923   10/12/19 0924  DME 3 n 1  Once        10/12/19 4128   10/12/19 0924  DME Bedside commode  Once       Question:  Patient needs a bedside commode to treat with the following condition  Answer:  S/P TKR (total knee replacement) using cement, right   10/12/19 0923          Diagnostic Studies: DG Knee 1-2 Views Right  Result Date: 10/12/2019 CLINICAL DATA:  Status post right total knee arthroplasty. EXAM: RIGHT KNEE - 1-2 VIEW COMPARISON:  None FINDINGS: Status post right total knee arthroplasty. Hardware components in anatomic alignment. No complication. Gas is noted within suprapatellar joint space. Overlying skin staples in place. IMPRESSION: Status post right total knee arthroplasty. Electronically Signed   By: Signa Kell M.D.   On: 10/12/2019 10:07    Disposition:      Follow-up Information    Evon Slack, PA-C Follow up in 2 week(s).   Specialties: Orthopedic Surgery, Emergency Medicine Contact information: [address] [place] [id number] [phone]                Signed: Patience Musca 10/14/2019, 8:51 AM

## 2019-10-14 DIAGNOSIS — M170 Bilateral primary osteoarthritis of knee: Secondary | ICD-10-CM | POA: Diagnosis not present

## 2019-10-14 NOTE — Progress Notes (Signed)
Physical Therapy Treatment Patient Details Name: Shelia Jones MRN: [id number] DOB: Aug 18, 1965 Today's Date: 10/14/2019    History of Present Illness Patient is s/p elective right TKA, no significant PMH.    PT Comments    Pt is a pleasant 54 year old F who was admitted for elective R TKA (10/12/2019). Pt continues to progress well, ambulating 250' with RW this session, limited in mobility and gait distance primarily by R knee pain and stiffness. Pt encountered in chair at start of session. Pt performs transfers with CGA (STS). Pt requiring CGA during ambulation 250' with RW and moderate cuing for safe RW management (maintaining close proximity, hand placement, maintaining feet within base of RW) and for more normalized gait pattern (forward gaze, increased L step length). Pt demonstrates deficits with pain, range of motion, strength, balance requiring continued skilled PT intervention to improve functional mobility.    Follow Up Recommendations  Home health PT;Supervision/Assistance - 24 hour     Equipment Recommendations  Rolling walker with 5" wheels    Recommendations for Other Services       Precautions / Restrictions Precautions Precautions: Fall Restrictions Weight Bearing Restrictions: Yes RLE Weight Bearing: Weight bearing as tolerated    Mobility  Bed Mobility               General bed mobility comments: Pt encountered seated in chair at start of session and returned to chair at end of session  Transfers Overall transfer level: Needs assistance Equipment used: Rolling walker (2 wheeled) Transfers: Sit to/from Stand Sit to Stand: Min guard         General transfer comment: Min verbal cues for hand and R foot placement  Ambulation/Gait Ambulation/Gait assistance: Min guard Gait Distance (Feet): 250 Feet Assistive device: Rolling walker (2 wheeled) Gait Pattern/deviations: Step-to pattern;Step-through pattern;Decreased step length - left;Decreased  stance time - right;Antalgic Gait velocity: decreased   General Gait Details: Slow, antalgic, step-to gait pattern that slowly progressed to beginning step-through pattern; mod verbal cues for general sequencing and positioning with the RW, improving L step length, upright/forward gaze   Stairs         General stair comments: Pt reports comfort with stairs at home and able to verbally describe appropriate sequencing   Wheelchair Mobility    Modified Rankin (Stroke Patients Only)       Balance Overall balance assessment: Needs assistance Sitting-balance support: Feet supported Sitting balance-Leahy Scale: Good     Standing balance support: Bilateral upper extremity supported;During functional activity Standing balance-Leahy Scale: Good Standing balance comment: reliant on RW; able to perform standing lateral weight shifting with RW support                            Cognition Arousal/Alertness: Awake/alert Behavior During Therapy: WFL for tasks assessed/performed Overall Cognitive Status: Within Functional Limits for tasks assessed                                 General Comments: patient is very pleasant, cooperative, daughter in room during session assisting with communication      Exercises Total Joint Exercises Ankle Circles/Pumps: AROM;Both;20 reps Quad Sets: Strengthening;Both;10 reps    General Comments        Pertinent Vitals/Pain Pain Assessment: 0-10 Pain Score: 4  Pain Location: R knee Pain Descriptors / Indicators: Aching;Sore Pain Intervention(s): Monitored during session    Home  Living                      Prior Function            PT Goals (current goals can now be found in the care plan section) Acute Rehab PT Goals Patient Stated Goal: to return home with husband PT Goal Formulation: With patient/family Time For Goal Achievement: 10/19/19 Potential to Achieve Goals: Good Progress towards PT goals:  Progressing toward goals    Frequency    BID      PT Plan Current plan remains appropriate    Co-evaluation              AM-PAC PT "6 Clicks" Mobility   Outcome Measure  Help needed turning from your back to your side while in a flat bed without using bedrails?: A Little Help needed moving from lying on your back to sitting on the side of a flat bed without using bedrails?: A Little Help needed moving to and from a bed to a chair (including a wheelchair)?: A Little Help needed standing up from a chair using your arms (e.g., wheelchair or bedside chair)?: A Little Help needed to walk in hospital room?: A Little Help needed climbing 3-5 steps with a railing? : A Little 6 Click Score: 18    End of Session Equipment Utilized During Treatment: Gait belt Activity Tolerance: Patient tolerated treatment well Patient left: with family/visitor present;with call bell/phone within reach;with chair alarm set;in chair (polar care donned to R knee) Nurse Communication: Mobility status PT Visit Diagnosis: Unsteadiness on feet (R26.81);Muscle weakness (generalized) (M62.81);Other abnormalities of gait and mobility (R26.89);Pain;Difficulty in walking, not elsewhere classified (R26.2) Pain - Right/Left: Right Pain - part of body: Knee     Time: 1120-1145 PT Time Calculation (min) (ACUTE ONLY): 25 min  Charges:  $Gait Training: 23-37 mins                     Kendal Hymen, PT, DPT 10/14/19, 2:29 PM

## 2019-10-14 NOTE — Progress Notes (Signed)
   Subjective: 2 Days Post-Op Procedure(s) (LRB): RIGHT TOTAL KNEE ARTHROPLASTY (Right) APPLICATION OF WOUND VAC [id number] (Right) Patient reports pain as moderate.   Patient is well, and has had no acute complaints or problems Denies any CP, SOB, ABD pain. We will continue therapy today.  Plan is to go Home after hospital stay.  Objective: Vital signs in last 24 hours: Temp:  [97.8 F (36.6 C)-99.6 F (37.6 C)] 97.8 F (36.6 C) (06/19 0834) Pulse Rate:  [68-75] 73 (06/19 0834) Resp:  [16-20] 20 (06/19 0834) BP: (128-136)/(71-74) 135/74 (06/19 0834) SpO2:  [97 %-100 %] 97 % (06/19 0834)  Intake/Output from previous day: 06/18 0701 - 06/19 0700 In: 240 [P.O.:240] Out: 700 [Urine:700] Intake/Output this shift: No intake/output data recorded.  Recent Labs    10/12/19 1311  HGB 12.4   Recent Labs    10/12/19 1311  WBC 10.1  RBC 4.61  HCT 38.6  PLT 260   Recent Labs    10/12/19 1311  NA 143  K 4.0  CL 109  CO2 29  BUN 8  CREATININE 0.51  GLUCOSE 94  CALCIUM 8.9   No results for input(s): LABPT, INR in the last 72 hours.  EXAM General - Patient is Alert, Appropriate and Oriented Extremity - Neurovascular intact Sensation intact distally Intact pulses distally Dorsiflexion/Plantar flexion intact No cellulitis present Compartment soft Dressing - dressing C/D/I and no drainage, prevena intact with out drainage Motor Function - intact, moving foot and toes well on exam.   Past Medical History:  Diagnosis Date  . Arthritis     Assessment/Plan:   2 Days Post-Op Procedure(s) (LRB): RIGHT TOTAL KNEE ARTHROPLASTY (Right) APPLICATION OF WOUND VAC [id number] (Right) Active Problems:   S/P TKR (total knee replacement) using cement, right  Estimated body mass index is 32.92 kg/m as calculated from the following:   Height as of this encounter: 5\' 2"  (1.575 m).   Weight as of this encounter: 81.6 kg. Advance diet Up with therapy  CM to assist with  discharge to home with HHPT today  DVT Prophylaxis - Lovenox, Foot Pumps and TED hose Weight-Bearing as tolerated to right leg   T. , PA-C [facility] 10/14/2019, 8:50 AM

## 2019-10-14 NOTE — Progress Notes (Signed)
Patient d/c home. Prevena in place. Instructions and prescriptions given to patient and family, verbalized understanding.

## 2019-10-14 NOTE — Plan of Care (Signed)

## 2019-10-17 ENCOUNTER — Other Ambulatory Visit: Payer: Self-pay

## 2019-10-17 ENCOUNTER — Other Ambulatory Visit: Payer: Self-pay | Admitting: Orthopedic Surgery

## 2019-10-17 ENCOUNTER — Ambulatory Visit
Admission: RE | Admit: 2019-10-17 | Discharge: 2019-10-17 | Disposition: A | Discharge status: Home / Self Care | Payer: BC Managed Care – PPO | Source: Ambulatory Visit | Attending: Orthopedic Surgery | Admitting: Orthopedic Surgery

## 2019-10-17 DIAGNOSIS — R2241 Localized swelling, mass and lump, right lower limb: Secondary | ICD-10-CM

## 2020-09-28 IMAGING — US US EXTREM LOW VENOUS*R*
1 series · 13 of 24 positions shown · non-contrast
Comparison: None.

CLINICAL DATA: Right lower extremity edema.  Evaluate for DVT.



[Series 1: us extrem low venous*right* · 0.08mm/px · 13 of 27 slices shown]
[im 1/27]
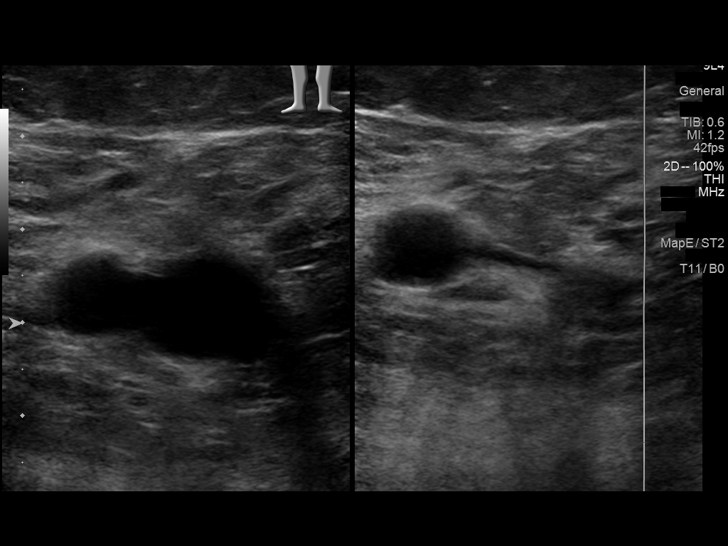
[im 3/27]
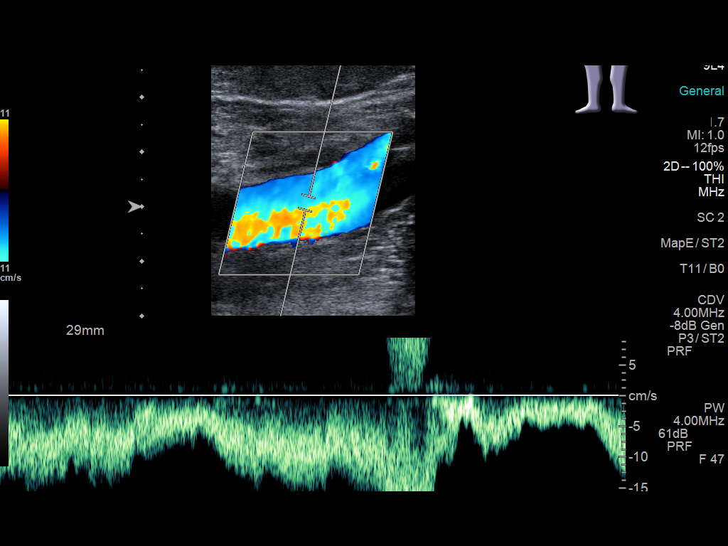
[im 5/27]
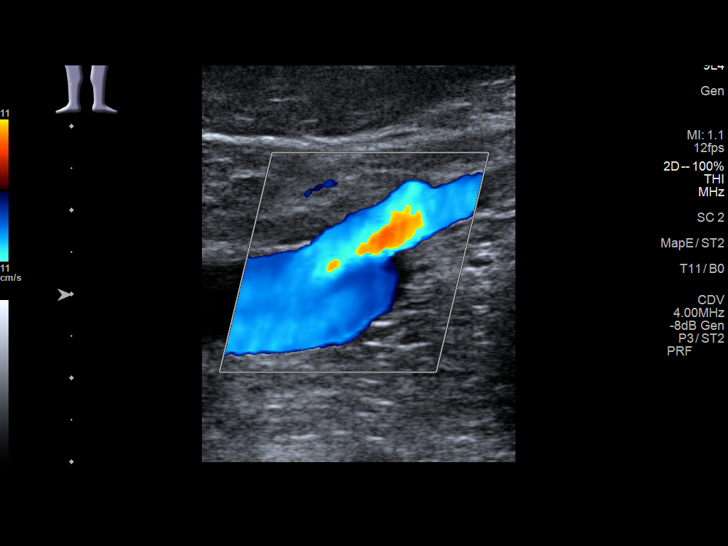
[im 7/27]
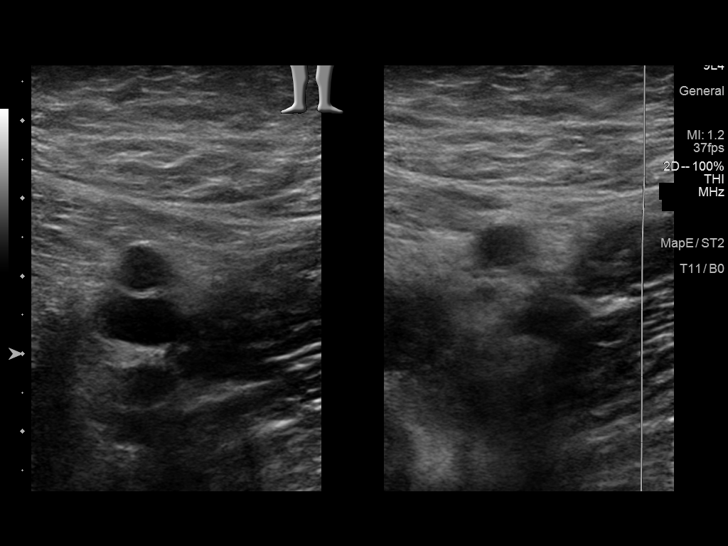
[im 10/27]
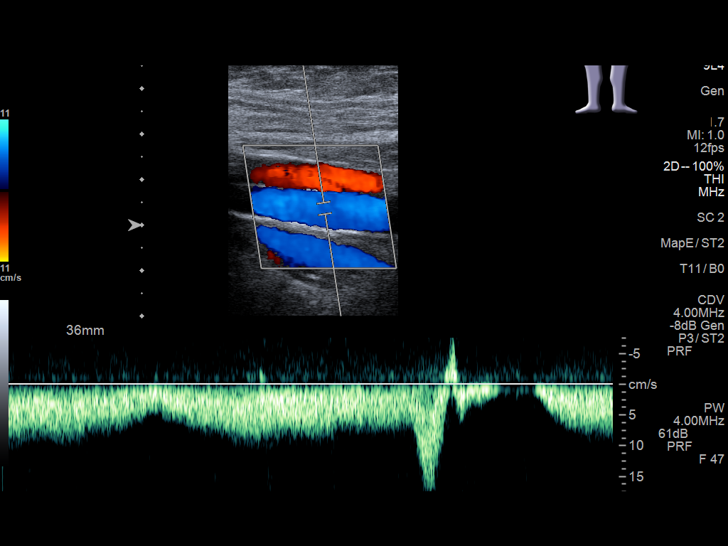
[im 12/27]
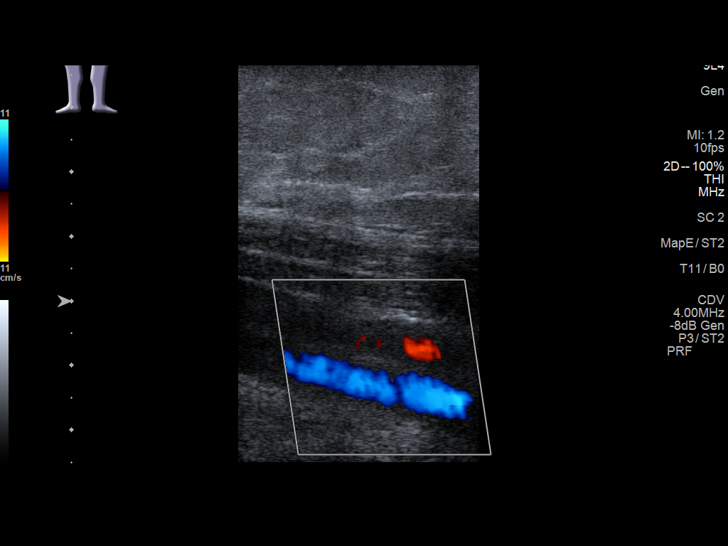
[im 14/27]
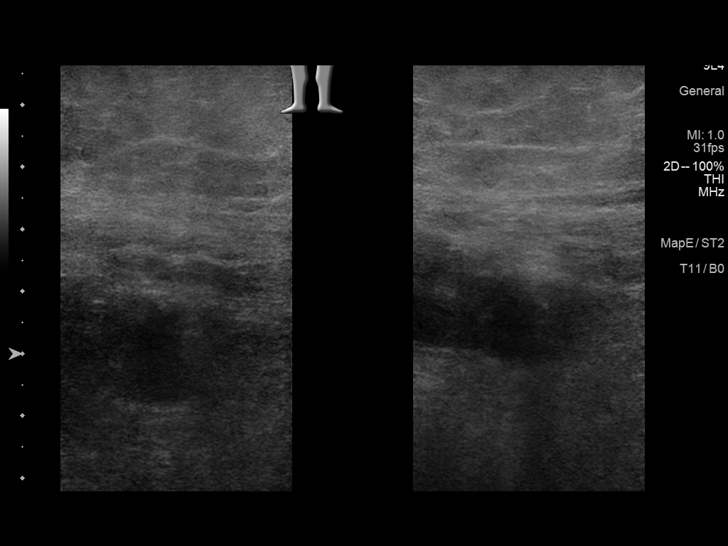
[im 15/27]
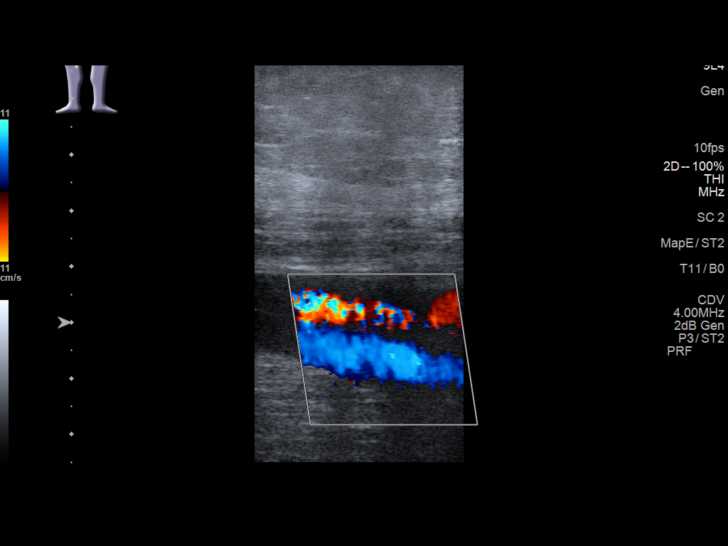
[im 17/27]
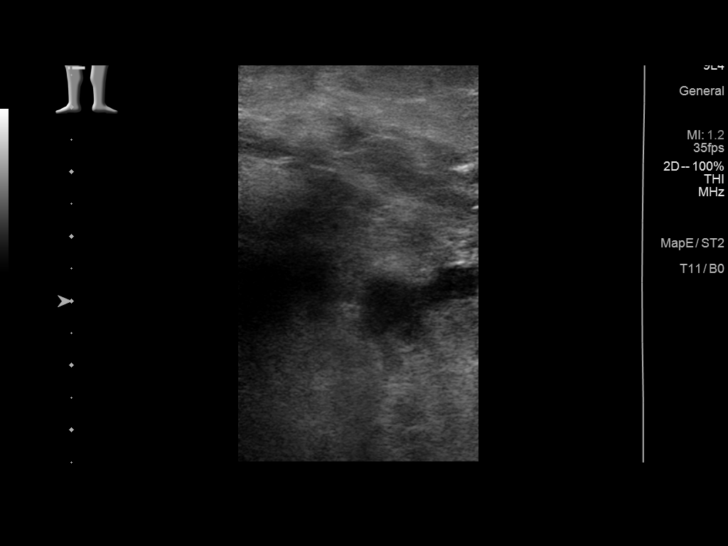
[im 20/27]
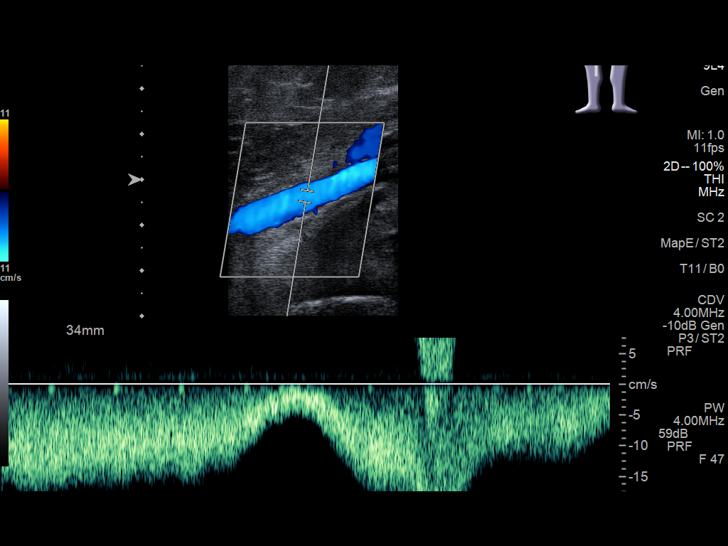
[im 22/27]
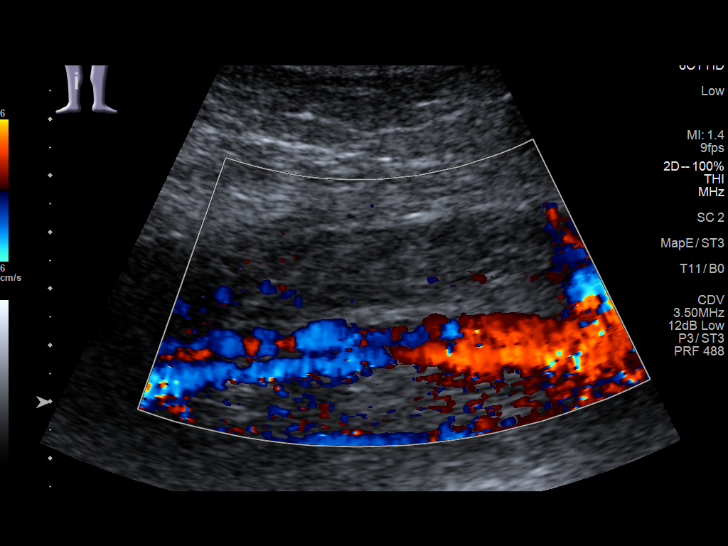
[im 24/27]
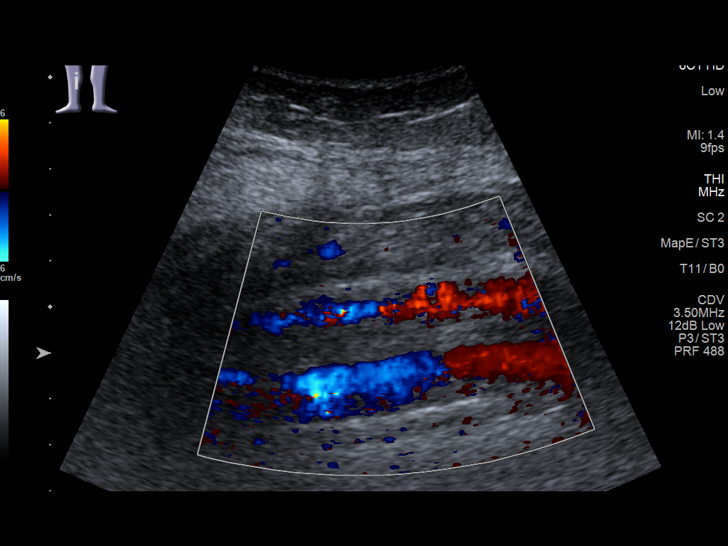
[im 27/27]
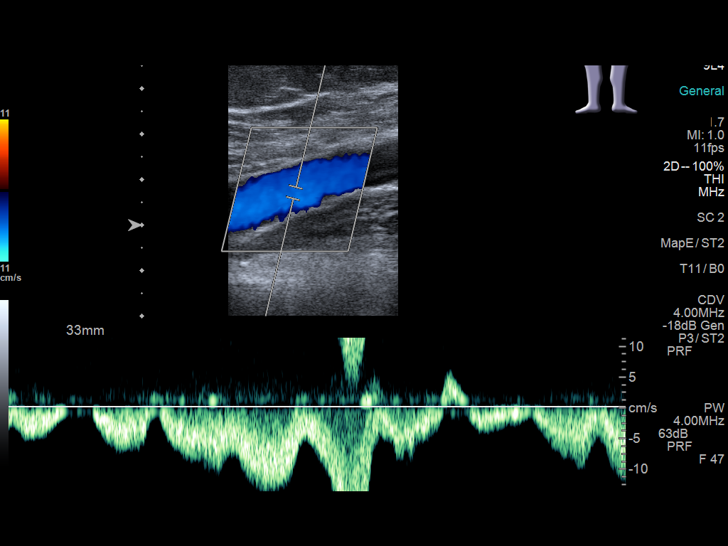

[13 of 24 positions shown; findings below may reference images not displayed]

FINDINGS: Contralateral Common Femoral Vein: Respiratory phasicity is normal
and symmetric with the symptomatic side. No evidence of thrombus.
Normal compressibility.

Common Femoral Vein: No evidence of thrombus. Normal
compressibility, respiratory phasicity and response to augmentation.

Saphenofemoral Junction: No evidence of thrombus. Normal
compressibility and flow on color Doppler imaging.

Profunda Femoral Vein: No evidence of thrombus. Normal
compressibility and flow on color Doppler imaging.

Femoral Vein: No evidence of thrombus. Normal compressibility,
respiratory phasicity and response to augmentation.

Popliteal Vein: No evidence of thrombus. Normal compressibility,
respiratory phasicity and response to augmentation.

Calf Veins: Appear patent where imaged.

Superficial Great Saphenous Vein: No evidence of thrombus. Normal
compressibility.

Venous Reflux:  None.

Other Findings:  None.
IMPRESSION: No evidence of DVT within the right lower extremity.
